# Patient Record
Sex: Male | Born: 1960 | Race: White | Hispanic: No | Marital: Married | Smoking: Never smoker
Health system: Southern US, Community
[De-identification: ages and names within clinical notes are randomized; demographics above are authoritative.]

## PROBLEM LIST (undated history)

## (undated) DIAGNOSIS — R7303 Prediabetes: Secondary | ICD-10-CM

## (undated) DIAGNOSIS — I10 Essential (primary) hypertension: Secondary | ICD-10-CM

## (undated) DIAGNOSIS — M1990 Unspecified osteoarthritis, unspecified site: Secondary | ICD-10-CM

## (undated) DIAGNOSIS — J309 Allergic rhinitis, unspecified: Secondary | ICD-10-CM

## (undated) DIAGNOSIS — E785 Hyperlipidemia, unspecified: Secondary | ICD-10-CM

## (undated) HISTORY — DX: Essential (primary) hypertension: I10

## (undated) HISTORY — DX: Allergic rhinitis, unspecified: J30.9

## (undated) HISTORY — DX: Hyperlipidemia, unspecified: E78.5

## (undated) HISTORY — DX: Unspecified osteoarthritis, unspecified site: M19.90

## (undated) HISTORY — DX: Prediabetes: R73.03

---

## 1999-06-16 ENCOUNTER — Encounter: Payer: Self-pay | Admitting: Internal Medicine

## 1999-06-16 ENCOUNTER — Ambulatory Visit (HOSPITAL_COMMUNITY): Admission: AD | Admit: 1999-06-16 | Discharge: 1999-06-16 | Payer: Self-pay | Admitting: Internal Medicine

## 2008-07-29 ENCOUNTER — Ambulatory Visit (HOSPITAL_COMMUNITY): Admission: RE | Admit: 2008-07-29 | Discharge: 2008-07-29 | Payer: Self-pay | Admitting: Internal Medicine

## 2012-03-14 ENCOUNTER — Other Ambulatory Visit (HOSPITAL_COMMUNITY): Payer: Self-pay | Admitting: Internal Medicine

## 2012-03-14 ENCOUNTER — Ambulatory Visit (HOSPITAL_COMMUNITY)
Admission: RE | Admit: 2012-03-14 | Discharge: 2012-03-14 | Disposition: A | Discharge status: Home / Self Care | Payer: BC Managed Care – PPO | Source: Ambulatory Visit | Attending: Internal Medicine | Admitting: Internal Medicine

## 2012-03-14 DIAGNOSIS — M47812 Spondylosis without myelopathy or radiculopathy, cervical region: Secondary | ICD-10-CM | POA: Insufficient documentation

## 2012-03-14 DIAGNOSIS — M25519 Pain in unspecified shoulder: Secondary | ICD-10-CM

## 2012-03-14 DIAGNOSIS — M542 Cervicalgia: Secondary | ICD-10-CM

## 2012-05-21 HISTORY — PX: COLONOSCOPY: SHX174

## 2013-01-07 ENCOUNTER — Encounter: Payer: Self-pay | Admitting: Internal Medicine

## 2013-02-26 ENCOUNTER — Encounter: Payer: Self-pay | Admitting: Internal Medicine

## 2013-02-26 ENCOUNTER — Ambulatory Visit (AMBULATORY_SURGERY_CENTER): Payer: Self-pay | Admitting: *Deleted

## 2013-02-26 VITALS — Ht 75.0 in | Wt 265.2 lb

## 2013-02-26 DIAGNOSIS — Z1211 Encounter for screening for malignant neoplasm of colon: Secondary | ICD-10-CM

## 2013-02-26 MED ORDER — MOVIPREP 100 G PO SOLR: ORAL | Status: DC

## 2013-02-26 NOTE — Progress Notes (Signed)
No allergies to eggs or soy. No prior anesthesia.  

## 2013-03-12 ENCOUNTER — Encounter: Payer: Self-pay | Admitting: Internal Medicine

## 2013-03-12 ENCOUNTER — Ambulatory Visit (AMBULATORY_SURGERY_CENTER): Payer: 59 | Admitting: Internal Medicine

## 2013-03-12 VITALS — BP 123/78 | HR 51 | Temp 98.1°F | Resp 15 | Ht 75.0 in | Wt 265.0 lb

## 2013-03-12 DIAGNOSIS — D126 Benign neoplasm of colon, unspecified: Secondary | ICD-10-CM

## 2013-03-12 DIAGNOSIS — Z1211 Encounter for screening for malignant neoplasm of colon: Secondary | ICD-10-CM

## 2013-03-12 MED ORDER — SODIUM CHLORIDE 0.9 % IV SOLN: 500.0000 mL | INTRAVENOUS | Status: DC

## 2013-03-12 NOTE — Patient Instructions (Signed)
YOU HAD AN ENDOSCOPIC PROCEDURE TODAY AT THE [facility]: Refer to the procedure report that was given to you for any specific questions about what was found during the examination.  If the procedure report does not answer your questions, please call your gastroenterologist to clarify.  If you requested that your care partner not be given the details of your procedure findings, then the procedure report has been included in a sealed envelope for you to review at your convenience later.  YOU SHOULD EXPECT: Some feelings of bloating in the abdomen. Passage of more gas than usual.  Walking can help get rid of the air that was put into your GI tract during the procedure and reduce the bloating. If you had a lower endoscopy (such as a colonoscopy or flexible sigmoidoscopy) you may notice spotting of blood in your stool or on the toilet paper. If you underwent a bowel prep for your procedure, then you may not have a normal bowel movement for a few days.  DIET: Your first meal following the procedure should be a light meal and then it is ok to progress to your normal diet.  A half-sandwich or bowl of soup is an example of a good first meal.  Heavy or fried foods are harder to digest and may make you feel nauseous or bloated.  Likewise meals heavy in dairy and vegetables can cause extra gas to form and this can also increase the bloating.  Drink plenty of fluids but you should avoid alcoholic beverages for 24 hours.  ACTIVITY: Your care partner should take you home directly after the procedure.  You should plan to take it easy, moving slowly for the rest of the day.  You can resume normal activity the day after the procedure however you should NOT DRIVE or use heavy machinery for 24 hours (because of the sedation medicines used during the test).    SYMPTOMS TO REPORT IMMEDIATELY: A gastroenterologist can be reached at any hour.  During normal business hours, 8:30 AM to 5:00 PM Monday through Friday,  call [phone].  After hours and on weekends, please call the GI answering service at [phone] who will take a message and have the physician on call contact you.   Following lower endoscopy (colonoscopy or flexible sigmoidoscopy):  Excessive amounts of blood in the stool  Significant tenderness or worsening of abdominal pains  Swelling of the abdomen that is new, acute  Fever of 100F or higher   FOLLOW UP: If any biopsies were taken you will be contacted by phone or by letter within the next 1-3 weeks.  Call your gastroenterologist if you have not heard about the biopsies in 3 weeks.  Our staff will call the home number listed on your records the next business day following your procedure to check on you and address any questions or concerns that you may have at that time regarding the information given to you following your procedure. This is a courtesy call and so if there is no answer at the home number and we have not heard from you through the emergency physician on call, we will assume that you have returned to your regular daily activities without incident.  SIGNATURES/CONFIDENTIALITY: You and/or your care partner have signed paperwork which will be entered into your electronic medical record.  These signatures attest to the fact that that the information above on your After Visit Summary has been reviewed and is understood.  Full responsibility of the confidentiality of   this discharge information lies with you and/or your care-partner.    You may resume your current medications today. Handout was given to your care partner on polyps. Please call if any questions or concerns.

## 2013-03-12 NOTE — Progress Notes (Signed)
No complaints noted in the recovery room. Maw   

## 2013-03-12 NOTE — Progress Notes (Signed)
Called to room to assist during endoscopic procedure.  Patient ID and intended procedure confirmed with present staff. Received instructions for my participation in the procedure from the performing physician.  

## 2013-03-12 NOTE — Op Note (Signed)
[facility] [address] [place], [address]   COLONOSCOPY PROCEDURE REPORT  PATIENT: Dale Tapia, Dale Tapia  MR#: [id number] BIRTHDATE: 1960/10/02 , 52  yrs. old GENDER: Male ENDOSCOPIST: Roxy Cedar, MD REFERRED [id number] Oneta Rack, M.D. PROCEDURE DATE:  03/12/2013 PROCEDURE:   Colonoscopy with snare polypectomy x 2 First Screening Colonoscopy - Avg.  risk and is 50 yrs.  old or older Yes.  Prior Negative Screening - Now for repeat screening. N/A  History of Adenoma - Now for follow-up colonoscopy & has been > or = to 3 yrs.  N/A  Polyps Removed Today? Yes. ASA CLASS:   Class II INDICATIONS:average risk screening. MEDICATIONS: MAC sedation, administered by CRNA and propofol (Diprivan) 350mg  IV  DESCRIPTION OF PROCEDURE:   After the risks benefits and alternatives of the procedure were thoroughly explained, informed consent was obtained.  A digital rectal exam revealed no abnormalities of the rectum.   The LB [id number]  endoscope was introduced through the anus and advanced to the cecum, which was identified by both the appendix and ileocecal valve. No adverse events experienced.   The quality of the prep was good, using MoviPrep  The instrument was then slowly withdrawn as the colon was fully examined.   COLON FINDINGS: The mucosa appeared normal in the terminal ileum. Two diminutive polyps were found at the cecum and in the ascending colon.  A polypectomy was performed with a cold snare.  The resection was complete and the polyp tissue was completely retrieved.   The colon mucosa was otherwise normal.  Retroflexed views revealed no abnormalities. The time to cecum=1 minutes 55 seconds.  Withdrawal time=10 minutes 28 seconds.  The scope was withdrawn and the procedure completed. COMPLICATIONS: There were no complications.  ENDOSCOPIC IMPRESSION: 1.   Normal mucosa in the terminal ileum 2.   Two diminutive polyps were found in the  colon;  polypectomy was performed with a cold snare 3.   The colon mucosa was otherwise normal  RECOMMENDATIONS: 1. Repeat colonoscopy in 5 years if polyp adenomatous; otherwise 10 years   eSigned:  Roxy Cedar, MD 03/12/2013 9:43 AM   cc: Lucky Cowboy, MD and The Patient

## 2013-03-13 ENCOUNTER — Telehealth: Payer: Self-pay | Admitting: *Deleted

## 2013-03-13 NOTE — Telephone Encounter (Signed)
  Follow up Call-  Call back number 03/12/2013  Post procedure Call Back phone  # [phone]  Permission to leave phone message Yes     Patient questions:  Do you have a fever, pain , or abdominal swelling? no Pain Score  0 *  Have you tolerated food without any problems? yes  Have you been able to return to your normal activities? yes  Do you have any questions about your discharge instructions: Diet   no Medications  no Follow up visit  no  Do you have questions or concerns about your Care? no  Actions: * If pain score is 4 or above: No action needed, pain <4.

## 2013-03-18 ENCOUNTER — Encounter: Payer: Self-pay | Admitting: Internal Medicine

## 2013-04-10 ENCOUNTER — Other Ambulatory Visit: Payer: Self-pay | Admitting: Emergency Medicine

## 2013-07-15 ENCOUNTER — Other Ambulatory Visit: Payer: Self-pay | Admitting: Physician Assistant

## 2013-07-31 ENCOUNTER — Other Ambulatory Visit: Payer: Self-pay | Admitting: Physician Assistant

## 2013-07-31 ENCOUNTER — Ambulatory Visit (INDEPENDENT_AMBULATORY_CARE_PROVIDER_SITE_OTHER): Payer: 59 | Admitting: Emergency Medicine

## 2013-07-31 ENCOUNTER — Encounter: Payer: Self-pay | Admitting: Emergency Medicine

## 2013-07-31 VITALS — BP 124/94 | HR 60 | Temp 98.2°F | Resp 18 | Ht 75.0 in | Wt 268.0 lb

## 2013-07-31 DIAGNOSIS — R7309 Other abnormal glucose: Secondary | ICD-10-CM

## 2013-07-31 DIAGNOSIS — R059 Cough, unspecified: Secondary | ICD-10-CM

## 2013-07-31 DIAGNOSIS — I10 Essential (primary) hypertension: Secondary | ICD-10-CM

## 2013-07-31 DIAGNOSIS — J329 Chronic sinusitis, unspecified: Secondary | ICD-10-CM

## 2013-07-31 DIAGNOSIS — M1990 Unspecified osteoarthritis, unspecified site: Secondary | ICD-10-CM

## 2013-07-31 DIAGNOSIS — J309 Allergic rhinitis, unspecified: Secondary | ICD-10-CM

## 2013-07-31 DIAGNOSIS — E782 Mixed hyperlipidemia: Secondary | ICD-10-CM

## 2013-07-31 DIAGNOSIS — E785 Hyperlipidemia, unspecified: Secondary | ICD-10-CM

## 2013-07-31 DIAGNOSIS — R05 Cough: Secondary | ICD-10-CM

## 2013-07-31 LAB — CBC WITH DIFFERENTIAL/PLATELET
Basophils Absolute: 0 K/uL (ref 0.0–0.1)
Basophils Relative: 0 % (ref 0–1)
Eosinophils Absolute: 0.2 K/uL (ref 0.0–0.7)
Eosinophils Relative: 2 % (ref 0–5)
HCT: 43.5 % (ref 39.0–52.0)
Hemoglobin: 15 g/dL (ref 13.0–17.0)
Lymphocytes Relative: 28 % (ref 12–46)
Lymphs Abs: 2.7 K/uL (ref 0.7–4.0)
MCH: 31.6 pg (ref 26.0–34.0)
MCHC: 34.5 g/dL (ref 30.0–36.0)
MCV: 91.6 fL (ref 78.0–100.0)
Monocytes Absolute: 1 K/uL (ref 0.1–1.0)
Monocytes Relative: 10 % (ref 3–12)
Neutro Abs: 5.9 K/uL (ref 1.7–7.7)
Neutrophils Relative %: 60 % (ref 43–77)
Platelets: 230 K/uL (ref 150–400)
RBC: 4.75 MIL/uL (ref 4.22–5.81)
RDW: 13.5 % (ref 11.5–15.5)
WBC: 9.8 K/uL (ref 4.0–10.5)

## 2013-07-31 LAB — HEMOGLOBIN A1C
Hgb A1c MFr Bld: 5.8 % — ABNORMAL HIGH
Mean Plasma Glucose: 120 mg/dL — ABNORMAL HIGH

## 2013-07-31 MED ORDER — ALBUTEROL SULFATE HFA 108 (90 BASE) MCG/ACT IN AERS: 2.0000 | INHALATION_SPRAY | Freq: Four times a day (QID) | RESPIRATORY_TRACT | Status: DC | PRN

## 2013-07-31 MED ORDER — BENZONATATE 100 MG PO CAPS: 100.0000 mg | ORAL_CAPSULE | Freq: Three times a day (TID) | ORAL | Status: DC | PRN

## 2013-07-31 MED ORDER — LEVOFLOXACIN 500 MG PO TABS: 500.0000 mg | ORAL_TABLET | Freq: Every day | ORAL | Status: AC

## 2013-07-31 NOTE — Progress Notes (Signed)
   Subjective:    Patient ID: Dale Tapia, male    DOB: 1960-09-12, 53 y.o.   MRN: [id number]  HPI Comments: 53 yo NON-Compliant male presents for 3 month F/U for HTN, Cholesterol, Pre-Dm, D. Deficient. Last labs. BS 111 T 168 TG 105 H 45 L 102 A1C 5.6 D 30 INSULIN 106 HE HAS BEEN ACTIVE AT WORK ONLY. He is not eating as health. BP has been trying to get BP at house but cuff to small 140s/ 80.   He has head congestion x 3 weeks with increased ear sensitivity. He has had congestion in chest but not as bad as head. He has not tried any OTC.   Sinusitis Associated symptoms include coughing and ear pain.  Cough Associated symptoms include ear pain.  Otalgia  Associated symptoms include coughing.      Medication List       This list is accurate as of: 07/31/13 11:33 AM.  Always use your most recent med list.               atenolol 100 MG tablet  Commonly known as:  TENORMIN  Take 100 mg by mouth daily.     losartan 100 MG tablet  Commonly known as:  COZAAR  Take 100 mg by mouth daily.       No Known Allergies Past Medical History  Diagnosis Date  . Hypertension   . Arthritis     neck and knees  . Allergy      Review of Systems  HENT: Positive for ear pain.   Respiratory: Positive for cough.    BP 124/94  Pulse 60  Temp(Src) 98.2 F (36.8 C) (Temporal)  Resp 18  Ht 6\' 3"  (1.905 m)  Wt 268 lb (121.564 kg)  BMI 33.50 kg/m2     Objective:   Physical Exam  Nursing note and vitals reviewed. Constitutional: He is oriented to person, place, and time. He appears well-developed and well-nourished.  HENT:  Head: Normocephalic and atraumatic.  Right Ear: External ear normal.  Left Ear: External ear normal.  Nose: Nose normal.  Mouth/Throat: Oropharynx is clear and moist. No oropharyngeal exudate.  Yellow TMs bilateral Maxillary tenderness Frontal tenderness    Eyes: Conjunctivae and EOM are normal.  Neck: Normal range of motion. Neck supple. No JVD  present. No thyromegaly present.  Cardiovascular: Normal rate, regular rhythm, normal heart sounds and intact distal pulses.   Pulmonary/Chest: Effort normal and breath sounds normal.  Abdominal: Soft. Bowel sounds are normal. He exhibits no distension and no mass. There is no tenderness. There is no rebound and no guarding.  Musculoskeletal: Normal range of motion. He exhibits no edema and no tenderness.  Lymphadenopathy:    He has no cervical adenopathy.  Neurological: He is alert and oriented to person, place, and time. He has normal reflexes. No cranial nerve deficit. Coordination normal.  Skin: Skin is warm and dry.  Psychiatric: He has a normal mood and affect. His behavior is normal. Judgment and thought content normal.          Assessment & Plan:  1.  3 month F/U for NON COmpliant HTN, Cholesterol, Pre-Dm, D. Deficient. Needs healthy diet, cardio QD and obtain healthy weight. Check Labs, Check BP if >130/80 call office  2. Sinusitis/ cough/ Allergic rhinitis- Allegra OTC, increase H2o, allergy hygiene explained. Levaquin 500 mg AD, Albuterol HFA AD, Tessalon Perles AD, w/c if SX increase or ER.

## 2013-07-31 NOTE — Patient Instructions (Signed)
We want weight loss that will last so you should lose 1-2 pounds a week.  THAT IS IT! Please pick THREE things a month to change. Once it is a habit check off the item. Then pick another three items off the list to become habits.  If you are already doing a habit on the list GREAT!  Cross that item off! o Don't drink your calories. Ie, alcohol, soda, fruit juice, and sweet tea.  o Drink more water. Drink a glass when you feel hungry or before each meal.  o Eat breakfast - Complex carb and protein (likeDannon light and fit yogurt, oatmeal, fruit, eggs, [place] bacon). o Measure your cereal.  Eat no more than one cup a day. (ie [place]) o Eat an apple a day. o Add a vegetable a day. o Try a new vegetable a month. o Use Pam! Stop using oil or butter to cook. o Don't finish your plate or use smaller plates. o Share your dessert. o Eat sugar free Jello for dessert or frozen grapes. o Don't eat 2-3 hours before bed. o Switch to whole wheat bread, pasta, and brown rice. o Make healthier choices when you eat out. No fries! o Pick baked chicken, NOT fried. o Don't forget to SLOW DOWN when you eat. It is not going anywhere.  o Take the stairs. o Park far away in the parking lot o [employer] (or weights) for 10 minutes while watching TV. o Walk at work for 10 minutes during break. o Walk outside 1 time a week with your friend, kids, dog, or significant other. o Start a walking group at [place] the mall as much as you can tolerate.  o Keep a food diary. o Weigh yourself daily. o Walk for 15 minutes 3 days per week. o Cook at home more often and eat out less.  If life happens and you go back to old habits, it is okay.  Just start over. You can do it!   If you experience chest pain, get short of breath, or tired during the exercise, please stop immediately and inform your doctor.    Bad carbs also include fruit juice, alcohol, and sweet tea. These are empty calories that do not signal to  your brain that you are full.   Please remember the good carbs are still carbs which convert into sugar. So please measure them out no more than 1/2-1 cup of rice, oatmeal, pasta, and beans.  Veggies are however free foods! Pile them on.   I like lean protein at every meal such as chicken, [place], pork chops, cottage cheese, etc. Just do not fry these meats and please center your meal around vegetable, the meats should be a side dish.   No all fruit is created equal. Please see the list below, the fruit at the bottom is higher in sugars than the fruit at the top   Hypertension Hypertension is another name for high blood pressure. High blood pressure may mean that your heart needs to work harder to pump blood. Blood pressure consists of two numbers, which includes a higher number over a lower number (example: 110/72). HOME CARE   Make lifestyle changes as told by your doctor. This may include weight loss and exercise.  Take your blood pressure medicine every day.  Limit how much salt you use.  Stop smoking if you smoke.  Do not use drugs.  Talk to your doctor if you are using decongestants or birth  control pills. These medicines might make blood pressure higher.  Females should not drink more than 1 alcoholic drink per day. Males should not drink more than 2 alcoholic drinks per day.  See your doctor as told. GET HELP RIGHT AWAY IF:   You have a blood pressure reading with a top number of 180 or higher.  You get a very bad headache.  You get blurred or changing vision.  You feel confused.  You feel weak, numb, or faint.  You get chest or belly (abdominal) pain.  You throw up (vomit).  You cannot breathe very well. MAKE SURE YOU:   Understand these instructions.  Will watch your condition.  Will get help right away if you are not doing well or get worse. Document Released: 10/24/2007 Document Revised: 07/30/2011 Document Reviewed: 10/24/2007 [facility] Patient  Information 2014 [place], [place]. Sinusitis Sinusitis is redness, soreness, and puffiness (inflammation) of the air pockets in the bones of your face (sinuses). The redness, soreness, and puffiness can cause air and mucus to get trapped in your sinuses. This can allow germs to grow and cause an infection.  HOME CARE   Drink enough fluids to keep your pee (urine) clear or pale yellow.  Use a humidifier in your home.  Run a hot shower to create steam in the bathroom. Sit in the bathroom with the door closed. Breathe in the steam 3 4 times a day.  Put a warm, moist washcloth on your face 3 4 times a day, or as told by your doctor.  Use salt water sprays (saline sprays) to wet the thick fluid in your nose. This can help the sinuses drain.  Only take medicine as told by your doctor. GET HELP RIGHT AWAY IF:   Your pain gets worse.  You have very bad headaches.  You are sick to your stomach (nauseous).  You throw up (vomit).  You are very sleepy (drowsy) all the time.  Your face is puffy (swollen).  Your vision changes.  You have a stiff neck.  You have trouble breathing. MAKE SURE YOU:   Understand these instructions.  Will watch your condition.  Will get help right away if you are not doing well or get worse. Document Released: 10/24/2007 Document Revised: 01/30/2012 Document Reviewed: 12/11/2011 [facility] Patient Information 2014 [place].

## 2013-08-01 ENCOUNTER — Encounter: Payer: Self-pay | Admitting: Emergency Medicine

## 2013-08-01 DIAGNOSIS — E1169 Type 2 diabetes mellitus with other specified complication: Secondary | ICD-10-CM | POA: Insufficient documentation

## 2013-08-01 DIAGNOSIS — J309 Allergic rhinitis, unspecified: Secondary | ICD-10-CM | POA: Insufficient documentation

## 2013-08-01 DIAGNOSIS — I10 Essential (primary) hypertension: Secondary | ICD-10-CM | POA: Insufficient documentation

## 2013-08-01 DIAGNOSIS — M1990 Unspecified osteoarthritis, unspecified site: Secondary | ICD-10-CM | POA: Insufficient documentation

## 2013-08-01 DIAGNOSIS — E785 Hyperlipidemia, unspecified: Secondary | ICD-10-CM | POA: Insufficient documentation

## 2013-08-01 LAB — HEPATIC FUNCTION PANEL
ALT: 31 U/L (ref 0–53)
AST: 34 U/L (ref 0–37)
Albumin: 4.5 g/dL (ref 3.5–5.2)
Alkaline Phosphatase: 80 U/L (ref 39–117)
BILIRUBIN DIRECT: 0.2 mg/dL (ref 0.0–0.3)
BILIRUBIN INDIRECT: 0.8 mg/dL (ref 0.2–1.2)
Total Bilirubin: 1 mg/dL (ref 0.2–1.2)
Total Protein: 6.8 g/dL (ref 6.0–8.3)

## 2013-08-01 LAB — BASIC METABOLIC PANEL WITH GFR
BUN: 17 mg/dL (ref 6–23)
CALCIUM: 9.5 mg/dL (ref 8.4–10.5)
CO2: 28 mEq/L (ref 19–32)
Chloride: 104 mEq/L (ref 96–112)
Creat: 1.08 mg/dL (ref 0.50–1.35)
GFR, EST NON AFRICAN AMERICAN: 80 mL/min
GLUCOSE: 81 mg/dL (ref 70–99)
POTASSIUM: 4.5 meq/L (ref 3.5–5.3)
SODIUM: 141 meq/L (ref 135–145)

## 2013-08-01 LAB — INSULIN, FASTING: Insulin fasting, serum: 11 u[IU]/mL (ref 3–28)

## 2013-08-01 LAB — LIPID PANEL
Cholesterol: 190 mg/dL (ref 0–200)
HDL: 54 mg/dL (ref >39)
LDL CALC: 122 mg/dL — AB (ref 0–99)
Total CHOL/HDL Ratio: 3.5 Ratio
Triglycerides: 68 mg/dL (ref <150)
VLDL: 14 mg/dL (ref 0–40)

## 2013-08-03 ENCOUNTER — Other Ambulatory Visit: Payer: Self-pay | Admitting: Internal Medicine

## 2013-08-07 ENCOUNTER — Other Ambulatory Visit: Payer: Self-pay | Admitting: Internal Medicine

## 2013-10-30 ENCOUNTER — Other Ambulatory Visit: Payer: Self-pay | Admitting: Physician Assistant

## 2013-11-09 ENCOUNTER — Other Ambulatory Visit: Payer: Self-pay | Admitting: *Deleted

## 2013-11-09 MED ORDER — CELEBREX 200 MG PO CAPS
ORAL_CAPSULE | ORAL | Status: DC
Start: 2013-11-09 — End: 2014-03-19

## 2013-11-09 MED ORDER — LOSARTAN POTASSIUM 100 MG PO TABS: 100.0000 mg | ORAL_TABLET | Freq: Every day | ORAL | Status: DC

## 2013-12-31 IMAGING — CR DG CERVICAL SPINE COMPLETE 4+V
5 series · 5 of 5 positions shown · non-contrast
Comparison: None.

CLINICAL DATA: Injury with left-sided pain

CERVICAL SPINE - COMPLETE 4+ VIEW

[view not recorded (1 of 5)]
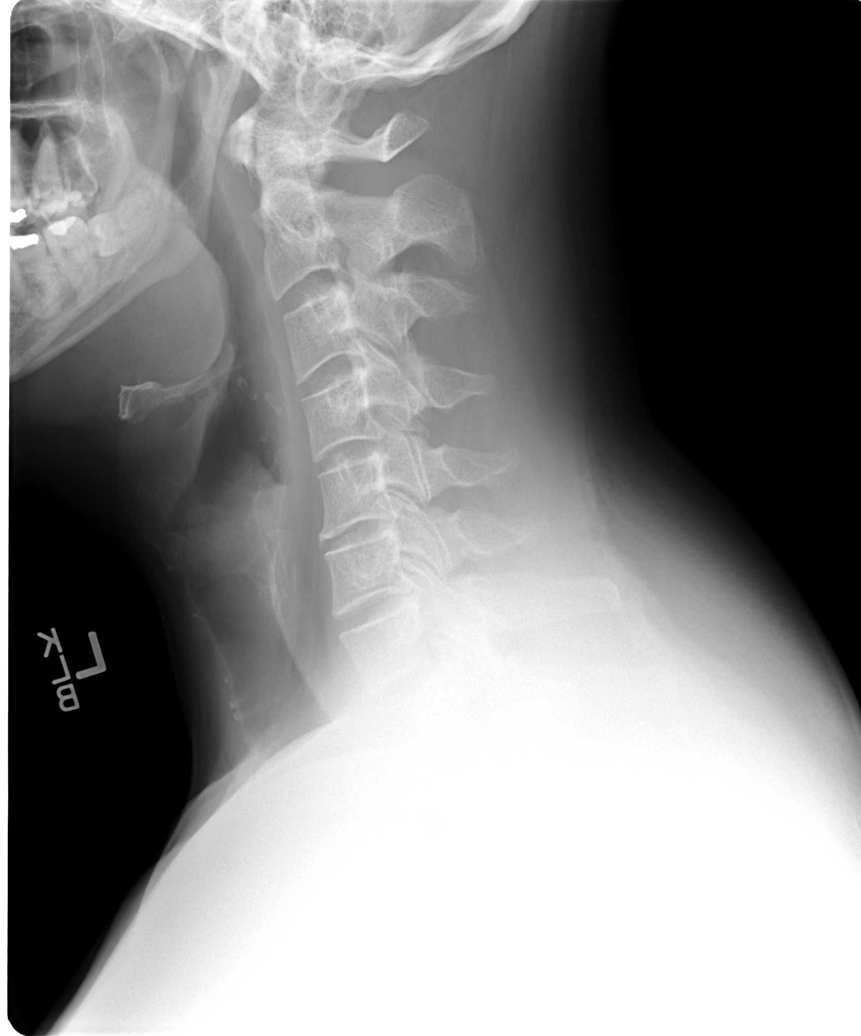

[view not recorded (2 of 5)]
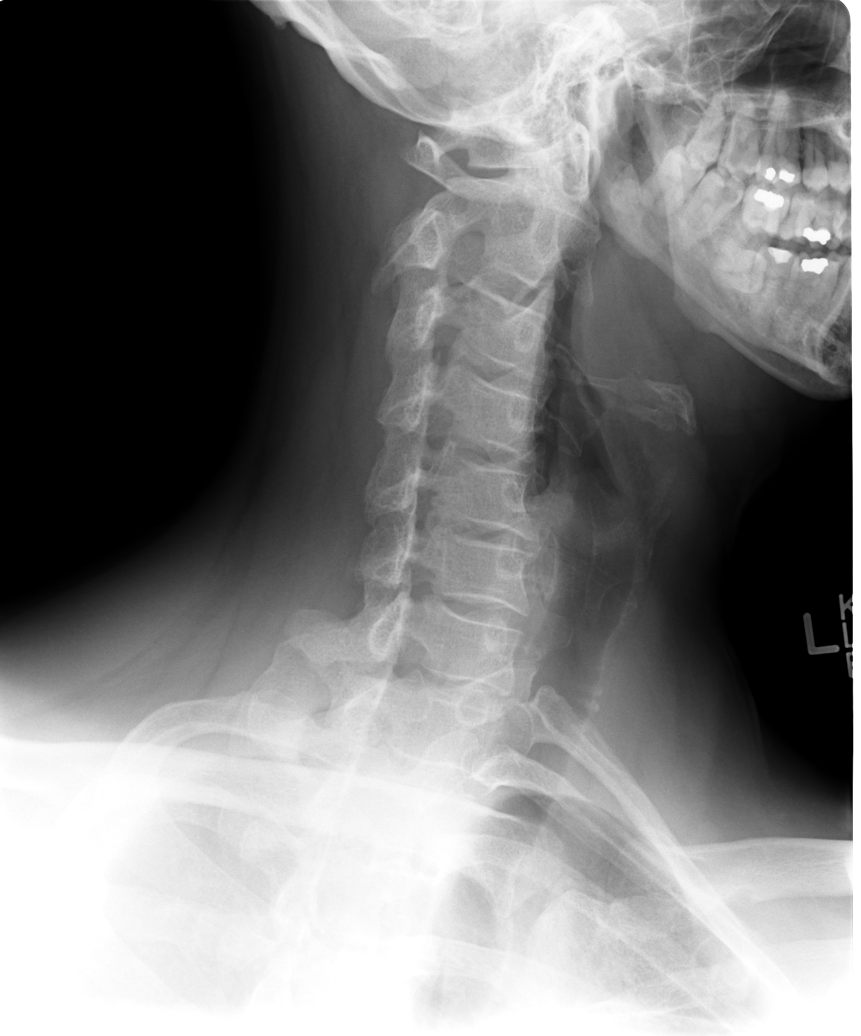

[view not recorded (3 of 5)]
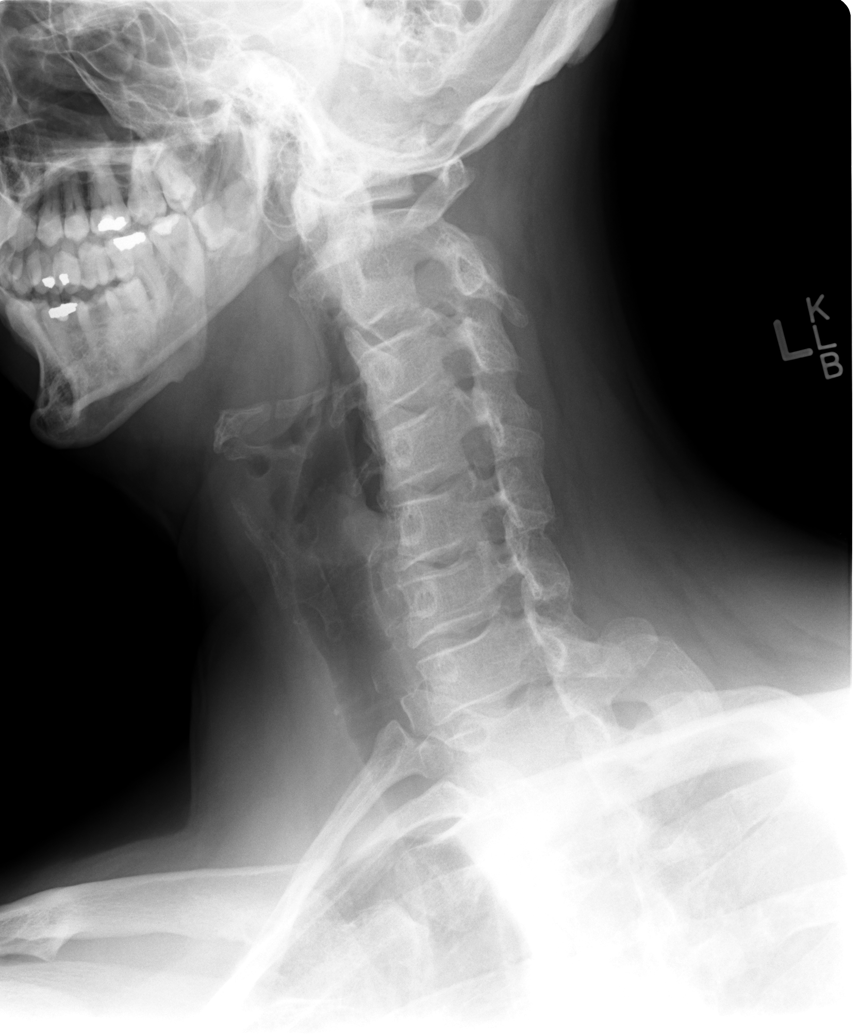

[view not recorded (4 of 5)]
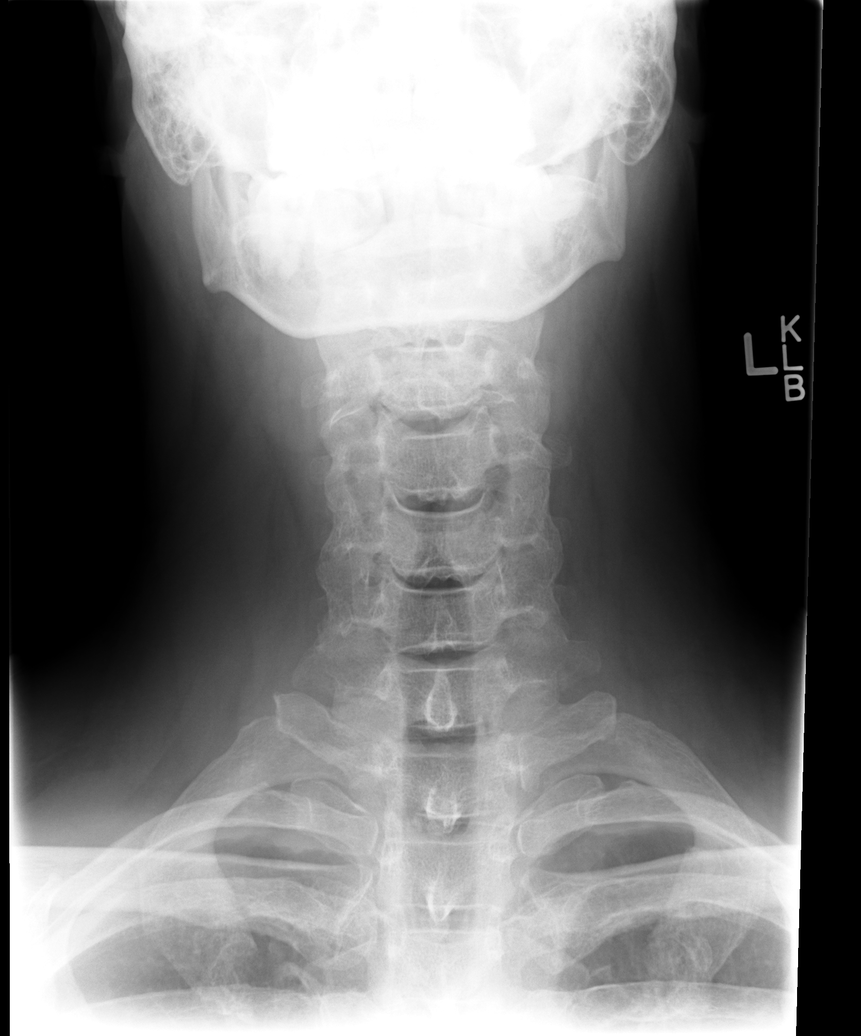

[view not recorded (5 of 5)]
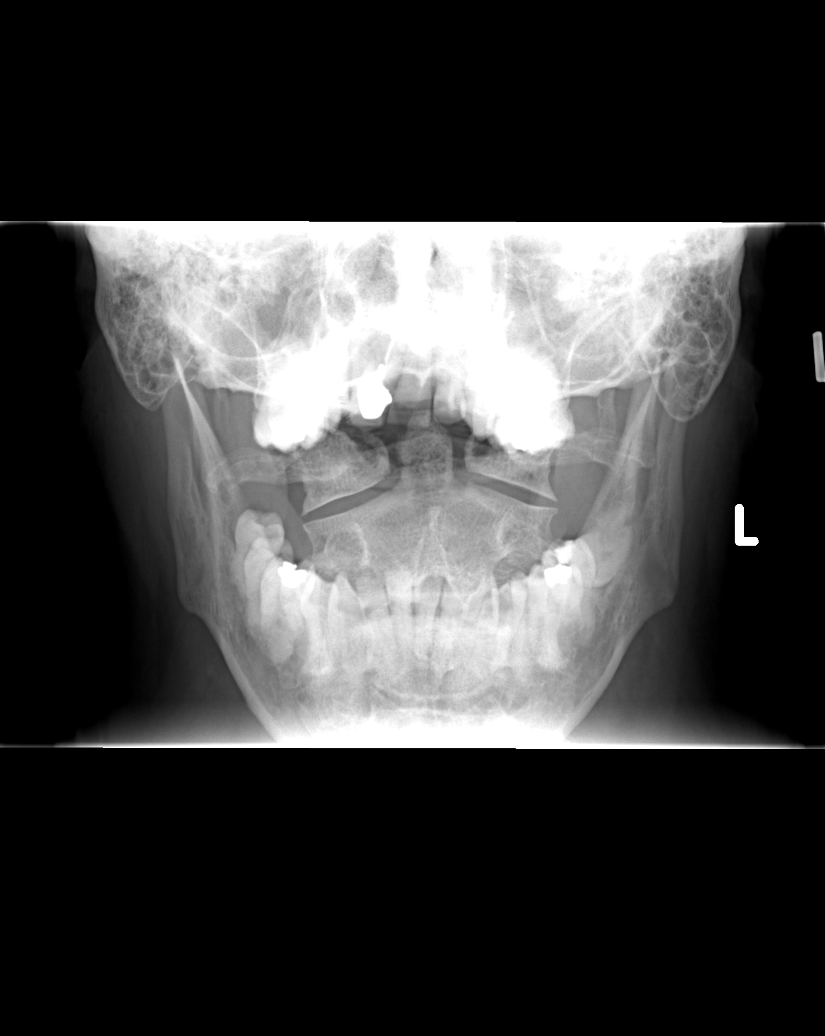

[5 of 5 positions shown; findings below may reference images not displayed]

FINDINGS: There is straightening of the neck on the lateral view.
There is degenerative spondylosis at C5-6 and C6-7 with mild disc
space narrowing and small marginal osteophytes.  These cause mild
encroachment upon the neural foramina.  No sign of fracture or soft
tissue swelling.
IMPRESSION: Chronic appearing spondylosis at C5-6 and C6-7.

## 2014-01-02 DIAGNOSIS — R7303 Prediabetes: Secondary | ICD-10-CM | POA: Insufficient documentation

## 2014-01-06 ENCOUNTER — Encounter: Payer: Self-pay | Admitting: Physician Assistant

## 2014-03-10 ENCOUNTER — Telehealth: Payer: Self-pay

## 2014-03-10 MED ORDER — PREDNISONE 20 MG PO TABS: ORAL_TABLET | ORAL | Status: DC

## 2014-03-10 NOTE — Telephone Encounter (Signed)
Patient called c/o cough, sinus issues, headache since Monday (2 days) , per Vicie Mutters, PA returned call to patient lmom and advised that since it has only been two days that he try Allegra, increase water/fluids, may try Aleve or Advil Sinus OTC, also sent in some Prednisolone  to try if needed, advised may need antibiotic if no better after at least 7-10 days

## 2014-03-19 ENCOUNTER — Encounter: Payer: Self-pay | Admitting: Physician Assistant

## 2014-03-19 ENCOUNTER — Ambulatory Visit (INDEPENDENT_AMBULATORY_CARE_PROVIDER_SITE_OTHER): Payer: BC Managed Care – PPO | Admitting: Physician Assistant

## 2014-03-19 ENCOUNTER — Ambulatory Visit: Payer: Self-pay | Admitting: Physician Assistant

## 2014-03-19 VITALS — BP 140/88 | HR 62 | Temp 98.6°F | Resp 18 | Ht 75.0 in | Wt 268.0 lb

## 2014-03-19 DIAGNOSIS — H6693 Otitis media, unspecified, bilateral: Secondary | ICD-10-CM

## 2014-03-19 DIAGNOSIS — R059 Cough, unspecified: Secondary | ICD-10-CM

## 2014-03-19 DIAGNOSIS — R05 Cough: Secondary | ICD-10-CM

## 2014-03-19 MED ORDER — AMOXICILLIN-POT CLAVULANATE 875-125 MG PO TABS: 1.0000 | ORAL_TABLET | Freq: Two times a day (BID) | ORAL | Status: DC

## 2014-03-19 MED ORDER — BENZONATATE 100 MG PO CAPS: 100.0000 mg | ORAL_CAPSULE | Freq: Four times a day (QID) | ORAL | Status: DC | PRN

## 2014-03-19 NOTE — Progress Notes (Signed)
Subjective:    Patient ID: Dale Tapia, male    DOB: Apr 20, 1961, 53 y.o.   MRN: [id number]  Cough This is a new problem. Episode onset: 1 week ago. The problem has been unchanged. Episode frequency: Cough comes and goes. The cough is non-productive. Associated symptoms include ear congestion, headaches, postnasal drip, a sore throat, shortness of breath and wheezing. Pertinent negatives include no chest pain, chills, ear pain, fever, heartburn, myalgias, nasal congestion, rash, rhinorrhea or sweats. Associated symptoms comments: Patient states he thinks the sore throat is from post nasal drip.. The symptoms are aggravated by lying down. Treatments tried: Advil Cold and Sinus and helped a little.  Prednisone.   The treatment provided mild relief. His past medical history is significant for environmental allergies.   Review of Systems  Constitutional: Negative.  Negative for fever, chills and fatigue.  HENT: Positive for postnasal drip and sore throat. Negative for dental problem, ear discharge, ear pain, rhinorrhea and trouble swallowing.        Scratchy voice  Eyes: Negative.   Respiratory: Positive for cough, shortness of breath and wheezing. Negative for chest tightness and stridor.        Patient states he did hear wheezing for a day or two.  Cardiovascular: Negative.  Negative for chest pain.  Gastrointestinal: Negative.  Negative for heartburn.  Musculoskeletal: Negative.  Negative for myalgias.  Skin: Negative.  Negative for rash.  Allergic/Immunologic: Positive for environmental allergies.  Neurological: Positive for headaches. Negative for dizziness and light-headedness.  Psychiatric/Behavioral: Negative.    Past Medical History  Diagnosis Date  . Hypertension   . Arthritis     neck and knees  . Allergic rhinitis, cause unspecified   . Hyperlipidemia   . Prediabetes    Current Outpatient Prescriptions on File Prior to Visit  Medication Sig Dispense Refill  . albuterol  (PROVENTIL HFA;VENTOLIN HFA) 108 (90 BASE) MCG/ACT inhaler Inhale 2 puffs into the lungs every 6 (six) hours as needed for wheezing or shortness of breath.  1 Inhaler  2  . atenolol (TENORMIN) 100 MG tablet TAKE ONE TABLET BY MOUTH ONCE DAILY  90 tablet  0  . losartan (COZAAR) 100 MG tablet Take 1 tablet (100 mg total) by mouth daily.  90 tablet  1   No current facility-administered medications on file prior to visit.   No Known Allergies    BP 140/88  Pulse 62  Temp(Src) 98.6 F (37 C) (Temporal)  Resp 18  Ht 6\' 3"  (1.905 m)  Wt 268 lb (121.564 kg)  BMI 33.50 kg/m2  SpO2 98%  Objective:   Physical Exam  Constitutional: He is oriented to person, place, and time. Vital signs are normal. He appears well-developed and well-nourished. He has a sickly appearance. No distress.  HENT:  Head: Normocephalic.  Right Ear: There is swelling and tenderness. No drainage. Tympanic membrane is injected, erythematous and bulging. Tympanic membrane is not scarred, not perforated and not retracted. A middle ear effusion is present.  Left Ear: External ear and ear canal normal. No drainage or swelling. Tympanic membrane is bulging. Tympanic membrane is not injected, not scarred, not perforated, not erythematous and not retracted. A middle ear effusion is present.  Nose: Nose normal. No mucosal edema, rhinorrhea or sinus tenderness. Right sinus exhibits no maxillary sinus tenderness and no frontal sinus tenderness. Left sinus exhibits no maxillary sinus tenderness and no frontal sinus tenderness.  Mouth/Throat: Uvula is midline and mucous membranes are normal. Mucous membranes are  not pale and not dry. No trismus in the jaw. No uvula swelling. Posterior oropharyngeal erythema present. No oropharyngeal exudate, posterior oropharyngeal edema or tonsillar abscesses.  Right ear canal was erythematous but not edematous.  Right TM was erythematous, injected and bulging with decreased cone of light.    Left ear  canal was not erythematous or edematous.  Left TM was non-erythematous and non-edematous with normal cone of light.  Left TM was bulging with clear fluid behind TM.  Turbinates were mildly erythematous but not edematous.  Mild posterior oropharyngeal erythema.  Eyes: Conjunctivae and lids are normal. Pupils are equal, round, and reactive to light. Right eye exhibits no discharge. Left eye exhibits no discharge. No scleral icterus.  Neck: Trachea normal and normal range of motion. Neck supple. No tracheal tenderness present. No tracheal deviation present.  Cardiovascular: Normal rate, regular rhythm, S1 normal, S2 normal, normal heart sounds and normal pulses.  Exam reveals no gallop, no distant heart sounds and no friction rub.   No murmur heard. Pulmonary/Chest: Effort normal and breath sounds normal. No stridor. Not tachypneic and not bradypneic. No respiratory distress. He has no decreased breath sounds. He has no wheezes. He has no rhonchi. He has no rales. He exhibits no tenderness.  Abdominal: Soft. There is no tenderness. There is no rebound and no guarding.  Musculoskeletal: Normal range of motion.  Lymphadenopathy:       Head (right side): No submental, no submandibular, no tonsillar, no preauricular, no posterior auricular and no occipital adenopathy present.       Head (left side): No submental, no submandibular, no tonsillar, no preauricular, no posterior auricular and no occipital adenopathy present.    He has no cervical adenopathy.       Right: No supraclavicular adenopathy present.       Left: No supraclavicular adenopathy present.  Neurological: He is alert and oriented to person, place, and time.  Skin: Skin is warm, dry and intact. No rash noted. He is not diaphoretic.  Psychiatric: He has a normal mood and affect. His speech is normal and behavior is normal. Judgment and thought content normal.      Assessment & Plan:  1. Bilateral acute otitis media, recurrence not  specified, unspecified otitis media type - Take Augmentin as prescribed-amoxicillin-clavulanate (AUGMENTIN) 875-125 MG per tablet; Take 1 tablet by mouth 2 (two) times daily. 10 days  Dispense: 20 tablet; Refill: 0 - While drinking fluids, pinch and hold your nose while swallowing to open eustachian tubes. -Take Allegra OTC- take 1 tablet by mouth daily for allergies. -Drink plenty of fluids to stay hydrated.  Every day you lose water through your breath, perspiration, urine and bowel movements. For your body to function properly, you must replenish its water supply by consuming beverages and foods that contain water. So how much fluid does the average, healthy adult living in a temperate climate need? The Institute of Medicine determined that an adequate intake (AI) for men is roughly about 13 cups (3 liters) of total beverages a day.  Please try to drink water or flavored water. Try to avoid soda due to it causing dehydration.  If you have a hard time with quitting soda, try to drink diet soda.  2. Cough -Take Tessalon Perles as prescribed for cough- benzonatate (TESSALON PERLES) 100 MG capsule; Take 1 capsule (100 mg total) by mouth every 6 (six) hours as needed for cough.  Dispense: 60 capsule; Refill: 1  Discussed all medication effects and SE's. If  you are not feeling better in 7-10 days, then please call the office.  Katherine Syme, Stephani Police, PA-C 9:48 AM [facility]

## 2014-03-19 NOTE — Patient Instructions (Addendum)
-   Take Augmentin as prescribed. - Take Tessalon Perles as prescribed for cough. -Drink plenty of fluids. Every day you lose water through your breath, perspiration, urine and bowel movements. For your body to function properly, you must replenish its water supply by consuming beverages and foods that contain water.  So how much fluid does the average, healthy adult living in a temperate climate need? The Institute of Medicine determined that an adequate intake (AI) for men is roughly about 13 cups (3 liters) of total beverages a day. The AI for women is about 9 cups (2.2 liters) of total beverages a day.  Please try to drink water or flavored water. Try to avoid soda due to it causing dehydration.  If you have a hard time with quitting soda, try to drink diet soda. - Take Allegra OTC- take 1 tablet daily for allergies. - while drinking fluids, pinch your nose and swallow to open eustachian tubes.  If you are not feeling better in 7-10 days, then please call the office.  Otitis Media Otitis media is redness, soreness, and inflammation of the middle ear. Otitis media may be caused by allergies or, most commonly, by infection. Often it occurs as a complication of the common cold. SIGNS AND SYMPTOMS Symptoms of otitis media may include:  Earache.  Fever.  Ringing in your ear.  Headache.  Leakage of fluid from the ear. DIAGNOSIS To diagnose otitis media, your health care provider will examine your ear with an otoscope. This is an instrument that allows your health care provider to see into your ear in order to examine your eardrum. Your health care provider also will ask you questions about your symptoms. TREATMENT  Typically, otitis media resolves on its own within 3-5 days. Your health care provider may prescribe medicine to ease your symptoms of pain. If otitis media does not resolve within 5 days or is recurrent, your health care provider may prescribe antibiotic medicines if he or she  suspects that a bacterial infection is the cause. HOME CARE INSTRUCTIONS   If you were prescribed an antibiotic medicine, finish it all even if you start to feel better.  Take medicines only as directed by your health care provider.  Keep all follow-up visits as directed by your health care provider. SEEK MEDICAL CARE IF:  You have otitis media only in one ear, or bleeding from your nose, or both.  You notice a lump on your neck.  You are not getting better in 3-5 days.  You feel worse instead of better. SEEK IMMEDIATE MEDICAL CARE IF:   You have pain that is not controlled with medicine.  You have swelling, redness, or pain around your ear or stiffness in your neck.  You notice that part of your face is paralyzed.  You notice that the bone behind your ear (mastoid) is tender when you touch it. MAKE SURE YOU:   Understand these instructions.  Will watch your condition.  Will get help right away if you are not doing well or get worse. Document Released: 02/10/2004 Document Revised: 09/21/2013 Document Reviewed: 12/02/2012 [facility] Patient Information 2015 [place], [place]. This information is not intended to replace advice given to you by your health care provider. Make sure you discuss any questions you have with your health care provider.

## 2014-04-05 ENCOUNTER — Ambulatory Visit (INDEPENDENT_AMBULATORY_CARE_PROVIDER_SITE_OTHER): Payer: BC Managed Care – PPO | Admitting: Physician Assistant

## 2014-04-05 ENCOUNTER — Encounter: Payer: Self-pay | Admitting: Physician Assistant

## 2014-04-05 VITALS — BP 162/98 | HR 66 | Temp 99.9°F | Resp 18 | Ht 75.0 in | Wt 276.0 lb

## 2014-04-05 DIAGNOSIS — R7303 Prediabetes: Secondary | ICD-10-CM

## 2014-04-05 DIAGNOSIS — H6691 Otitis media, unspecified, right ear: Secondary | ICD-10-CM

## 2014-04-05 DIAGNOSIS — E349 Endocrine disorder, unspecified: Secondary | ICD-10-CM

## 2014-04-05 DIAGNOSIS — M1990 Unspecified osteoarthritis, unspecified site: Secondary | ICD-10-CM

## 2014-04-05 DIAGNOSIS — M545 Low back pain, unspecified: Secondary | ICD-10-CM

## 2014-04-05 DIAGNOSIS — E785 Hyperlipidemia, unspecified: Secondary | ICD-10-CM

## 2014-04-05 DIAGNOSIS — I10 Essential (primary) hypertension: Secondary | ICD-10-CM

## 2014-04-05 DIAGNOSIS — E559 Vitamin D deficiency, unspecified: Secondary | ICD-10-CM

## 2014-04-05 DIAGNOSIS — R7309 Other abnormal glucose: Secondary | ICD-10-CM

## 2014-04-05 MED ORDER — CEFUROXIME AXETIL 500 MG PO TABS: 500.0000 mg | ORAL_TABLET | Freq: Two times a day (BID) | ORAL | Status: DC

## 2014-04-05 MED ORDER — MELOXICAM 7.5 MG PO TABS: 7.5000 mg | ORAL_TABLET | Freq: Every day | ORAL | Status: DC

## 2014-04-05 NOTE — Progress Notes (Signed)
Subjective:    Patient ID: Dale Tapia, male    DOB: [birth year], 53 y.o.   MRN: [id number]  HPI 53 y.o. male  presents for 3 month follow up with hypertension, hyperlipidemia, prediabetes and vitamin D.  He also presents for continuing bilateral ear pain and nasal congestion.  Patient was last seen on 03/19/14 and put on Augmentin for 10 days and Tessalon Perles for otitis media and cough.  Patient states he took medication as prescribed.  He states the tessalon perles are helping with cough.  Patient states he also had a nose bleed and noticed dried up blood in nose and blood in nasal secretions.   His blood pressure has not been controlled at home, today their BP is BP: (!) 162/98 mmHg He does not workout. He denies chest pain, shortness of breath, dizziness.   He is not on cholesterol medication and denies myalgias. His cholesterol is not at goal. The cholesterol last visit was:   Lab Results  Component Value Date   CHOL 190 07/31/2013   HDL 54 07/31/2013   LDLCALC 122* 07/31/2013   TRIG 68 07/31/2013   CHOLHDL 3.5 07/31/2013   He has not been working on diet and exercise for prediabetes, and denies increased appetite, polydipsia and polyuria. Last A1C in the office was:  Lab Results  Component Value Date   HGBA1C 5.8* 07/31/2013   Patient is on Vitamin D supplement.  Mulitvitamin- twice a day. No results found for: VD25OH    Current Medications:  Current Outpatient Prescriptions on File Prior to Visit  Medication Sig Dispense Refill  . atenolol (TENORMIN) 100 MG tablet TAKE ONE TABLET BY MOUTH ONCE DAILY 90 tablet 0  . losartan (COZAAR) 100 MG tablet Take 1 tablet (100 mg total) by mouth daily. 90 tablet 1   No current facility-administered medications on file prior to visit.  Patient states he is also taking supplement for testosterone OTC. Medical History:  Past Medical History  Diagnosis Date  . Hypertension   . Arthritis     neck and knees  . Allergic rhinitis,  cause unspecified   . Hyperlipidemia   . Prediabetes    Allergies: No Known Allergies  Family history- Review and unchanged Social history- Review and unchanged  Review of Systems  Constitutional: Positive for fever. Negative for chills, diaphoresis and fatigue.  HENT: Positive for congestion and ear pain. Negative for dental problem, ear discharge, postnasal drip, rhinorrhea, sinus pressure, sore throat, trouble swallowing and voice change.   Eyes: Negative.   Respiratory: Negative.   Cardiovascular: Negative.   Gastrointestinal: Negative.   Endocrine: Negative.  Negative for polydipsia, polyphagia and polyuria.  Genitourinary: Negative.   Musculoskeletal: Positive for back pain and neck pain. Negative for myalgias and gait problem.       States he picked up 200 pound TV about 2 weeks ago.  He thinks he pulled his back out.  He states the pain is a 5/6 out of 10 right now.  He states that bending over hurts more and sitting for long periods of time.  He states the pain stays in low back and does not radiate anywhere.  Patient states he was diagnosed with arthritis in neck.   Skin: Negative.  Negative for rash.  Neurological: Negative.   Psychiatric/Behavioral: Negative.        He states he is working nights and is having difficulty sleeping.     BP 162/98 mmHg  Pulse 66  Temp(Src) 99.9 F (37.7  C) (Temporal)  Resp 18  Ht 6\' 3"  (1.905 m)  Wt 276 lb (125.193 kg)  BMI 34.50 kg/m2  SpO2 98%  Wt Readings from Last 3 Encounters:  04/05/14 276 lb (125.193 kg)  03/19/14 268 lb (121.564 kg)  07/31/13 268 lb (121.564 kg)   Objective:   Physical Exam  Vitals Reviewed. General Appearance: Well nourished, in no apparent distress. Eyes: PERRLA, EOMs, conjunctiva no swelling or erythema Sinuses: No Frontal/maxillary tenderness ENT/Mouth: Ext aud canals clear on left and right is mildly erythematous, TM on right erythematous and TM on left non-erythematous.  Both TMs without bulging. No  erythema, swelling, or exudate on post pharynx.  Tonsils not swollen or erythematous. Hearing normal.  Neck: Supple, thyroid normal.  Respiratory: Respiratory effort normal, CTAB.  No w/r/r or stridor.  Cardio: RRR with no MRGs. Brisk peripheral pulses without edema.  Abdomen: Soft, + normal BS.  Non tender, no guarding, rebound, hernias, masses. Lymphatics: Non tender without lymphadenopathy.  Musculoskeletal: Back forward flexion painful at 45-90 degrees.  Back extension, lateral side bending and twisting intact and full ROM.  Negative straight leg test bilaterally.  Positive trendelenburg sign on right.  Full ROM, 5/5 strength, normal gait.  Skin: Warm, dry without rashes, lesions, ecchymosis.  Petechiae seen on dorsal surface of feet bilaterally.  Patient also has onychomycosis bilaterally. Neuro: Cranial nerves intact. Normal muscle tone, no cerebellar symptoms. Sensation intact.  Psych: Awake and oriented X 3, normal affect, Insight and Judgment appropriate.      Assessment & Plan:  1. Essential hypertension Continue medication as prescribed, monitor blood pressure at home. Continue DASH diet.  Reminder to go to the ER if any CP, SOB, nausea, dizziness, severe HA, changes vision/speech, left arm numbness and tingling, and jaw pain. - CBC with Differential - BASIC METABOLIC PANEL WITH GFR - Hepatic function panel  2. Prediabetes Continue diet and start exercising 120 minutes per week. Check A1C.  Might start on Metformin depending on lab results. - Hemoglobin A1c - Insulin, fasting  3. Hyperlipidemia Continue diet and start exercising 120 minutes per week. Check cholesterol. - Hepatic function panel - Lipid panel   4. Arthritis/Right sided low back pain without sciatica Take Meloxicam with food as prescribed for muscle pain - meloxicam (MOBIC) 7.5 MG tablet; Take 1 tablet (7.5 mg total) by mouth daily.  Dispense: 30 tablet; Refill: 0 -Do NOT take Mobic with any other OTC NSAIDs  (ibuprofen, Aleve or Advil) to help prevent ulcers.  5. Otitis media follow up, not resolved, right - Take Ceftin as prescribed and take with food- cefUROXime (CEFTIN) 500 MG tablet; Take 1 tablet (500 mg total) by mouth 2 (two) times daily with a meal. 10 days  Dispense: 20 tablet; Refill: 0 -Use saline nasal spray.  Do NOT use Q-Tips in ears or nose.  Take 2 sprays in each nostril at bedtime.  Make sure you spray towards the outside of each nostril towards the outer corner of your eye, hold nose close and tilt head back.    6. Vitamin D deficiency Check level and continue taking multivitamin BID. - Vit D  25 hydroxy (rtn osteoporosis monitoring)  7. Testosterone deficiency Check level and will call you with lab results.   - Testosterone  Continue diet and meds as discussed. Further disposition pending results of labs. Discussed medication effects and SE's.  Pt agreed to treatment plan. Please keep physical appt on 05/03/14 or can reschedule to February 2016 with Vicie Mutters, PA-C  Vishal Sandlin, Stephani Police, PA-C 10:31 AM [facility]

## 2014-04-05 NOTE — Patient Instructions (Addendum)
-Continue medications as prescribed. -Take Ceftin as prescribed for otitis media for 10 days -Use saline nasal spray.  Stay away from Q-Tips in nose and ears. -Take Meloxicam as prescribed with food for back pain and arthritis.  Do not take NSAIDs over the counter with it due to high risk of ulcers. -Start exercising 120 minutes per week. -Diet- low carb, lots of veggies and white meat like [place] and chicken.    Please follow up for physical in January or February 2016 the latest.   I will call you with results from blood work.    Back Pain, Adult Low back pain is very common. About 1 in 5 people have back pain.The cause of low back pain is rarely dangerous. The pain often gets better over time.About half of people with a sudden onset of back pain feel better in just 2 weeks. About 8 in 10 people feel better by 6 weeks.  CAUSES Some common causes of back pain include:  Strain of the muscles or ligaments supporting the spine.  Wear and tear (degeneration) of the spinal discs.  Arthritis.  Direct injury to the back. DIAGNOSIS Most of the time, the direct cause of low back pain is not known.However, back pain can be treated effectively even when the exact cause of the pain is unknown.Answering your caregiver's questions about your overall health and symptoms is one of the most accurate ways to make sure the cause of your pain is not dangerous. If your caregiver needs more information, he or she may order lab work or imaging tests (X-rays or MRIs).However, even if imaging tests show changes in your back, this usually does not require surgery. HOME CARE INSTRUCTIONS For many people, back pain returns.Since low back pain is rarely dangerous, it is often a condition that people can learn to [facility] their own.   Remain active. It is stressful on the back to sit or stand in one place. Do not sit, drive, or stand in one place for more than 30 minutes at a time. Take short walks on level  surfaces as soon as pain allows.Try to increase the length of time you walk each day.  Do not stay in bed.Resting more than 1 or 2 days can delay your recovery.  Do not avoid exercise or work.Your body is made to move.It is not dangerous to be active, even though your back may hurt.Your back will likely heal faster if you return to being active before your pain is gone.  Pay attention to your body when you bend and lift. Many people have less discomfortwhen lifting if they bend their knees, keep the load close to their bodies,and avoid twisting. Often, the most comfortable positions are those that put less stress on your recovering back.  Find a comfortable position to sleep. Use a firm mattress and lie on your side with your knees slightly bent. If you lie on your back, put a pillow under your knees.  Only take over-the-counter or prescription medicines as directed by your caregiver. Over-the-counter medicines to reduce pain and inflammation are often the most helpful.Your caregiver may prescribe muscle relaxant drugs.These medicines help dull your pain so you can more quickly return to your normal activities and healthy exercise.  Put ice on the injured area.  Put ice in a plastic bag.  Place a towel between your skin and the bag.  Leave the ice on for 15-20 minutes, 03-04 times a day for the first 2 to 3 days. After that,  ice and heat may be alternated to reduce pain and spasms.  Ask your caregiver about trying back exercises and gentle massage. This may be of some benefit.  Avoid feeling anxious or stressed.Stress increases muscle tension and can worsen back pain.It is important to recognize when you are anxious or stressed and learn ways to manage it.Exercise is a great option. SEEK MEDICAL CARE IF:  You have pain that is not relieved with rest or medicine.  You have pain that does not improve in 1 week.  You have new symptoms.  You are generally not feeling  well. SEEK IMMEDIATE MEDICAL CARE IF:   You have pain that radiates from your back into your legs.  You develop new bowel or bladder control problems.  You have unusual weakness or numbness in your arms or legs.  You develop nausea or vomiting.  You develop abdominal pain.  You feel faint. Document Released: 05/07/2005 Document Revised: 11/06/2011 Document Reviewed: 09/08/2013 [facility] Patient Information 2015 [place], [place]. This information is not intended to replace advice given to you by your health care provider. Make sure you discuss any questions you have with your health care provider.    Back Injury Prevention Back injuries can be extremely painful and difficult to heal. After having one back injury, you are much more likely to experience another later on. It is important to learn how to avoid injuring or re-injuring your back. The following tips can help you to prevent a back injury. PHYSICAL FITNESS  Exercise regularly and try to develop good tone in your abdominal muscles. Your abdominal muscles provide a lot of the support needed by your back.  Do aerobic exercises (walking, jogging, biking, swimming) regularly.  Do exercises that increase balance and strength (tai chi, yoga) regularly. This can decrease your risk of falling and injuring your back.  Stretch before and after exercising.  Maintain a healthy weight. The more you weigh, the more stress is placed on your back. For every pound of weight, 10 times that amount of pressure is placed on the back. DIET  Talk to your caregiver about how much calcium and vitamin D you need per day. These nutrients help to prevent weakening of the bones (osteoporosis). Osteoporosis can cause broken (fractured) bones that lead to back pain.  Include good sources of calcium in your diet, such as dairy products, green, leafy vegetables, and products with calcium added (fortified).  Include good sources of vitamin D in your diet,  such as milk and foods that are fortified with vitamin D.  Consider taking a nutritional supplement or a multivitamin if needed.  Stop smoking if you smoke. POSTURE  Sit and stand up straight. Avoid leaning forward when you sit or hunching over when you stand.  Choose chairs with good low back (lumbar) support.  If you work at a desk, sit close to your work so you do not need to lean over. Keep your chin tucked in. Keep your neck drawn back and elbows bent at a right angle. Your arms should look like the letter "L."  Sit high and close to the steering wheel when you drive. Add a lumbar support to your car seat if needed.  Avoid sitting or standing in one position for too long. Take breaks to get up, stretch, and walk around at least once every hour. Take breaks if you are driving for long periods of time.  Sleep on your side with your knees slightly bent, or sleep on your back with  a pillow under your knees. Do not sleep on your stomach. LIFTING, TWISTING, AND REACHING  Avoid heavy lifting, especially repetitive lifting. If you must do heavy lifting:  Stretch before lifting.  Work slowly.  Rest between lifts.  Use carts and dollies to move objects when possible.  Make several small trips instead of carrying 1 heavy load.  Ask for help when you need it.  Ask for help when moving big, awkward objects.  Follow these steps when lifting:  Stand with your feet shoulder-width apart.  Get as close to the object as you can. Do not try to pick up heavy objects that are far from your body.  Use handles or lifting straps if they are available.  Bend at your knees. Squat down, but keep your heels off the floor.  Keep your shoulders pulled back, your chin tucked in, and your back straight.  Lift the object slowly, tightening the muscles in your legs, abdomen, and buttocks. Keep the object as close to the center of your body as possible.  When you put a load down, use these same  guidelines in reverse.  Do not:  Lift the object above your waist.  Twist at the waist while lifting or carrying a load. Move your feet if you need to turn, not your waist.  Bend over without bending at your knees.  Avoid reaching over your head, across a table, or for an object on a high surface. OTHER TIPS  Avoid wet floors and keep sidewalks clear of ice to prevent falls.  Do not sleep on a mattress that is too soft or too hard.  Keep items that are used frequently within easy reach.  Put heavier objects on shelves at waist level and lighter objects on lower or higher shelves.  Find ways to decrease your stress, such as exercise, massage, or relaxation techniques. Stress can build up in your muscles. Tense muscles are more vulnerable to injury.  Seek treatment for depression or anxiety if needed. These conditions can increase your risk of developing back pain. SEEK MEDICAL CARE IF:  You injure your back.  You have questions about diet, exercise, or other ways to prevent back injuries. MAKE SURE YOU:  Understand these instructions.  Will watch your condition.  Will get help right away if you are not doing well or get worse. Document Released: 06/14/2004 Document Revised: 07/30/2011 Document Reviewed: 06/18/2011 [facility] Patient Information 2015 [place], [place]. This information is not intended to replace advice given to you by your health care provider. Make sure you discuss any questions you have with your health care provider.

## 2014-04-06 ENCOUNTER — Other Ambulatory Visit: Payer: Self-pay | Admitting: Physician Assistant

## 2014-04-06 DIAGNOSIS — R7303 Prediabetes: Secondary | ICD-10-CM

## 2014-04-06 LAB — HEPATIC FUNCTION PANEL
ALBUMIN: 4.1 g/dL (ref 3.5–5.2)
ALK PHOS: 66 U/L (ref 39–117)
ALT: 41 U/L (ref 0–53)
AST: 31 U/L (ref 0–37)
Bilirubin, Direct: 0.1 mg/dL (ref 0.0–0.3)
Indirect Bilirubin: 0.4 mg/dL (ref 0.2–1.2)
Total Bilirubin: 0.5 mg/dL (ref 0.2–1.2)
Total Protein: 6.6 g/dL (ref 6.0–8.3)

## 2014-04-06 LAB — CBC WITH DIFFERENTIAL/PLATELET
Basophils Absolute: 0 10*3/uL (ref 0.0–0.1)
Basophils Relative: 0 % (ref 0–1)
EOS ABS: 0.2 10*3/uL (ref 0.0–0.7)
Eosinophils Relative: 2 % (ref 0–5)
HCT: 41.6 % (ref 39.0–52.0)
Hemoglobin: 14 g/dL (ref 13.0–17.0)
LYMPHS ABS: 1.8 10*3/uL (ref 0.7–4.0)
Lymphocytes Relative: 18 % (ref 12–46)
MCH: 31.2 pg (ref 26.0–34.0)
MCHC: 33.7 g/dL (ref 30.0–36.0)
MCV: 92.7 fL (ref 78.0–100.0)
MPV: 11 fL (ref 9.4–12.4)
Monocytes Absolute: 1.1 10*3/uL — ABNORMAL HIGH (ref 0.1–1.0)
Monocytes Relative: 11 % (ref 3–12)
Neutro Abs: 7 10*3/uL (ref 1.7–7.7)
Neutrophils Relative %: 69 % (ref 43–77)
Platelets: 205 10*3/uL (ref 150–400)
RBC: 4.49 MIL/uL (ref 4.22–5.81)
RDW: 13.5 % (ref 11.5–15.5)
WBC: 10.2 10*3/uL (ref 4.0–10.5)

## 2014-04-06 LAB — LIPID PANEL
CHOL/HDL RATIO: 2.8 ratio
CHOLESTEROL: 179 mg/dL (ref 0–200)
HDL: 63 mg/dL (ref >39)
LDL Cholesterol: 102 mg/dL — ABNORMAL HIGH (ref 0–99)
Triglycerides: 69 mg/dL (ref <150)
VLDL: 14 mg/dL (ref 0–40)

## 2014-04-06 LAB — BASIC METABOLIC PANEL WITH GFR
BUN: 18 mg/dL (ref 6–23)
CO2: 27 meq/L (ref 19–32)
CREATININE: 1.07 mg/dL (ref 0.50–1.35)
Calcium: 9.1 mg/dL (ref 8.4–10.5)
Chloride: 109 mEq/L (ref 96–112)
GFR, Est African American: >89 mL/min
GFR, Est Non African American: 80 mL/min
Glucose, Bld: 108 mg/dL — ABNORMAL HIGH (ref 70–99)
Potassium: 4.2 mEq/L (ref 3.5–5.3)
Sodium: 144 mEq/L (ref 135–145)

## 2014-04-06 LAB — INSULIN, FASTING: INSULIN FASTING, SERUM: 32.6 u[IU]/mL — AB (ref 2.0–19.6)

## 2014-04-06 LAB — VITAMIN D 25 HYDROXY (VIT D DEFICIENCY, FRACTURES): Vit D, 25-Hydroxy: 25 ng/mL — ABNORMAL LOW (ref 30–100)

## 2014-04-06 LAB — HEMOGLOBIN A1C
HEMOGLOBIN A1C: 6.4 % — AB (ref <5.7)
Mean Plasma Glucose: 137 mg/dL — ABNORMAL HIGH (ref <117)

## 2014-04-06 LAB — TESTOSTERONE: Testosterone: 181 ng/dL — ABNORMAL LOW (ref 300–890)

## 2014-04-06 MED ORDER — METFORMIN HCL ER 500 MG PO TB24: ORAL_TABLET | ORAL | Status: DC

## 2014-04-17 ENCOUNTER — Other Ambulatory Visit: Payer: Self-pay | Admitting: Internal Medicine

## 2014-04-17 DIAGNOSIS — M545 Low back pain: Secondary | ICD-10-CM

## 2014-04-17 MED ORDER — CYCLOBENZAPRINE HCL 10 MG PO TABS: ORAL_TABLET | ORAL | Status: DC

## 2014-04-17 MED ORDER — PREDNISONE (PAK) 10 MG PO TABS: ORAL_TABLET | ORAL | Status: DC

## 2014-04-19 ENCOUNTER — Encounter: Payer: Self-pay | Admitting: Internal Medicine

## 2014-04-19 ENCOUNTER — Ambulatory Visit (INDEPENDENT_AMBULATORY_CARE_PROVIDER_SITE_OTHER): Payer: BC Managed Care – PPO | Admitting: Internal Medicine

## 2014-04-19 VITALS — BP 142/88 | HR 68 | Temp 97.9°F | Resp 18 | Ht 75.0 in | Wt 275.0 lb

## 2014-04-19 DIAGNOSIS — M545 Low back pain, unspecified: Secondary | ICD-10-CM

## 2014-04-19 NOTE — Progress Notes (Signed)
Subjective:    Patient ID: Dale Tapia, male    DOB: 02-22-1961, 53 y.o.   MRN: [id number]  HPI A very nicew 53 yo MWM with 2-3 week hx/o positional right lumbabo w/o sciatica who was treated over the week end with now 3rd of 5 days or prednisone & cyclobenzaprine and he reports significant improvement from 8/10 down to 2-3/10 severity. No UT sx's, sciatica and systems review is otherwise negative.  Medication Sig  . atenolol (TENORMIN) 100 MG tablet TAKE ONE TABLET BY MOUTH ONCE DAILY  . cyclobenzaprine (FLEXERIL) 10 MG tablet Take 1/2 to 1 tablet 3 x day for muscle spasms  . losartan (COZAAR) 100 MG tablet Take 1 tablet (100 mg total) by mouth daily.  . meloxicam (MOBIC) 7.5 MG tablet Take 1 tablet (7.5 mg total) by mouth daily.  . metFORMIN (GLUCOPHAGE XR) 500 MG 24 hr tablet Start taking 1 tablet PO Qdaily with largest meal.  If tolerating once a day dosing, then increase to 1 tablet PO BID with largest meals.  . Multiple Vitamins-Minerals (MULTIVITAMIN WITH MINERALS) tablet Take 1 tablet by mouth daily.  . predniSONE (STERAPRED UNI-PAK) 10 MG tablet Take 1 tablet  3 x day for inflammation  . cefUROXime (CEFTIN) 500 MG tablet Take 1 tablet (500 mg total) by mouth 2 (two) times daily with a meal. 10 days   No Known Allergies   Past Medical History  Diagnosis Date  . Hypertension   . Arthritis     neck and knees  . Allergic rhinitis, cause unspecified   . Hyperlipidemia   . Prediabetes     Review of Systems  In addition to the HPI above,  No Fever-chills,  No Headache, No changes with Vision or hearing,  No problems swallowing food or Liquids,  No Chest pain or productive Cough or Shortness of Breath,  No Abdominal pain, No Nausea or Vomitting, Bowel movements are regular,  No Blood in stool or Urine,  No dysuria,  No new skin rashes or bruises,  No new joints pains-aches,  No new weakness, tingling, numbness in any extremity,  No recent weight loss,  No polyuria,  polydypsia or polyphagia,  No significant Mental Stressors.  A full 10 point Review of Systems was done, except as stated above, all other Review of Systems were negative    Objective:   Physical Exam   BP 142/88 mmHg  Pulse 68  Temp(Src) 97.9 F (36.6 C)  Resp 18  Ht 6\' 3"  (1.905 m)  Wt 275 lb (124.739 kg)  BMI 34.37 kg/m2  HEENT - Eac's patent. TM's Nl. EOM's full. PERRLA. NasoOroPharynx clear. Neck - supple. Nl Thyroid. Carotids 2+ & No bruits, nodes, JVD Chest - Clear equal BS w/o Rales, rhonchi, wheezes. Cor - Nl HS. RRR w/o sig MGR. PP 1(+). No edema. Abd - No palpable organomegaly, masses or tenderness. BS nl. Back - (+) slight low right para-lumbar tenderness w/o spasm evident. MS- FROM w/o deformities. Muscle power, tone and bulk Nl. Gait Nl. (-) SLR & fig 4 - bilat. Neuro - No obvious Cr N abnormalities. Sensory, motor and Cerebellar functions appear Nl w/o focal abnormalities. Psyche - Mental status normal & appropriate.  No delusions, ideations or obvious mood abnormalities.    Assessment & Plan:   1) Lumbago resolving -   - continuing same conservative course.  - advised that he may return to work tonite which apparently does involve some lifting with precautions for proper body mechanics  in lifting.

## 2014-05-03 ENCOUNTER — Ambulatory Visit (INDEPENDENT_AMBULATORY_CARE_PROVIDER_SITE_OTHER): Payer: BC Managed Care – PPO | Admitting: Physician Assistant

## 2014-05-03 VITALS — BP 148/90 | HR 100 | Temp 98.1°F | Resp 16 | Wt 275.0 lb

## 2014-05-03 DIAGNOSIS — R7303 Prediabetes: Secondary | ICD-10-CM

## 2014-05-03 DIAGNOSIS — Z125 Encounter for screening for malignant neoplasm of prostate: Secondary | ICD-10-CM

## 2014-05-03 DIAGNOSIS — M545 Low back pain, unspecified: Secondary | ICD-10-CM

## 2014-05-03 DIAGNOSIS — Z0001 Encounter for general adult medical examination with abnormal findings: Secondary | ICD-10-CM

## 2014-05-03 DIAGNOSIS — J309 Allergic rhinitis, unspecified: Secondary | ICD-10-CM

## 2014-05-03 DIAGNOSIS — R6889 Other general symptoms and signs: Secondary | ICD-10-CM

## 2014-05-03 DIAGNOSIS — E785 Hyperlipidemia, unspecified: Secondary | ICD-10-CM

## 2014-05-03 DIAGNOSIS — E669 Obesity, unspecified: Secondary | ICD-10-CM

## 2014-05-03 DIAGNOSIS — M1990 Unspecified osteoarthritis, unspecified site: Secondary | ICD-10-CM

## 2014-05-03 DIAGNOSIS — I10 Essential (primary) hypertension: Secondary | ICD-10-CM

## 2014-05-03 MED ORDER — CYCLOBENZAPRINE HCL 10 MG PO TABS: ORAL_TABLET | ORAL | Status: DC

## 2014-05-03 MED ORDER — MELOXICAM 15 MG PO TABS: 15.0000 mg | ORAL_TABLET | Freq: Every day | ORAL | Status: DC

## 2014-05-03 MED ORDER — ATENOLOL 100 MG PO TABS: 100.0000 mg | ORAL_TABLET | Freq: Every day | ORAL | Status: DC

## 2014-05-03 NOTE — Patient Instructions (Addendum)
Bad carbs also include fruit juice, alcohol, and sweet tea. These are empty calories that do not signal to your brain that you are full.   Please remember the good carbs are still carbs which convert into sugar. So please measure them out no more than 1/2-1 cup of rice, oatmeal, pasta, and beans  Veggies are however free foods! Pile them on.   Not all fruit is created equal. Please see the list below, the fruit at the bottom is higher in sugars than the fruit at the top. Please avoid all dried fruits.     We want weight loss that will last so you should lose 1-2 pounds a week.  THAT IS IT! Please pick THREE things a month to change. Once it is a habit check off the item. Then pick another three items off the list to become habits.  If you are already doing a habit on the list GREAT!  Cross that item off! o Don't drink your calories. Ie, alcohol, soda, fruit juice, and sweet tea.  o Drink more water. Drink a glass when you feel hungry or before each meal.  o Eat breakfast - Complex carb and protein (likeDannon light and fit yogurt, oatmeal, fruit, eggs, [place] bacon). o Measure your cereal.  Eat no more than one cup a day. (ie [place]) o Eat an apple a day. o Add a vegetable a day. o Try a new vegetable a month. o Use Pam! Stop using oil or butter to cook. o Don't finish your plate or use smaller plates. o Share your dessert. o Eat sugar free Jello for dessert or frozen grapes. o Don't eat 2-3 hours before bed. o Switch to whole wheat bread, pasta, and brown rice. o Make healthier choices when you eat out. No fries! o Pick baked chicken, NOT fried. o Don't forget to SLOW DOWN when you eat. It is not going anywhere.  o Take the stairs. o Park far away in the parking lot o [employer] (or weights) for 10 minutes while watching TV. o Walk at work for 10 minutes during break. o Walk outside 1 time a week with your friend, kids, dog, or significant other. o Start a walking group at  [place] the mall as much as you can tolerate.  o Keep a food diary. o Weigh yourself daily. o Walk for 15 minutes 3 days per week. o Cook at home more often and eat out less.  If life happens and you go back to old habits, it is okay.  Just start over. You can do it!   If you experience chest pain, get short of breath, or tired during the exercise, please stop immediately and inform your doctor.   9 Ways to Naturally Increase Testosterone Levels  1.   Lose Weight If you're overweight, shedding the excess pounds may increase your testosterone levels, according to research presented at the Endocrine Society's 2012 meeting. Overweight men are more likely to have low testosterone levels to begin with, so this is an important trick to increase your body's testosterone production when you need it most.  2.   High-Intensity Exercise like Peak Fitness  Short intense exercise has a proven positive effect on increasing testosterone levels and preventing its decline. That's unlike aerobics or prolonged moderate exercise, which have shown to have negative or no effect on testosterone levels. Having a whey protein meal after exercise can further enhance the satiety/testosterone-boosting impact (hunger hormones cause the opposite  effect on your testosterone and libido). Here's a summary of what a typical high-intensity Peak Fitness routine might look like: " Warm up for three minutes  " Exercise as hard and fast as you can for 30 seconds. You should feel like you couldn't possibly go on another few seconds  " Recover at a slow to moderate pace for 90 seconds  " Repeat the high intensity exercise and recovery 7 more times .  3.   Consume Plenty of Zinc The mineral zinc is important for testosterone production, and supplementing your diet for as little as six weeks has been shown to cause a marked improvement in testosterone among men with low levels.1 Likewise, research has shown that restricting  dietary sources of zinc leads to a significant decrease in testosterone, while zinc supplementation increases it2 -- and even protects men from exercised-induced reductions in testosterone levels.3 It's estimated that up to 29 percent of adults over the age of 60 may have lower than recommended zinc intakes; even when dietary supplements were added in, an estimated 20-25 percent of older adults still had inadequate zinc intakes, according to a [employer] and Nutrition Examination Survey.4 Your diet is the best source of zinc; along with protein-rich foods like meats and fish, other good dietary sources of zinc include raw milk, raw cheese, beans, and yogurt or kefir made from raw milk. It can be difficult to obtain enough dietary zinc if you're a vegetarian, and also for meat-eaters as well, largely because of conventional farming methods that rely heavily on chemical fertilizers and pesticides. These chemicals deplete the soil of nutrients ... nutrients like zinc that must be absorbed by plants in order to be passed on to you. In many cases, you may further deplete the nutrients in your food by the way you prepare it. For most food, cooking it will drastically reduce its levels of nutrients like zinc . particularly over-cooking, which many people do. If you decide to use a zinc supplement, stick to a dosage of less than 40 mg a day, as this is the recommended adult upper limit. Taking too much zinc can interfere with your body's ability to absorb other minerals, especially copper, and may cause nausea as a side effect.  4.   Strength Training In addition to [employer] Fitness, strength training is also known to boost testosterone levels, provided you are doing so intensely enough. When strength training to boost testosterone, you'll want to increase the weight and lower your number of reps, and then focus on exercises that work a large number of muscles, such as dead lifts or squats.  You can "turbo-charge"  your weight training by going slower. By slowing down your movement, you're actually turning it into a high-intensity exercise. Super Slow movement allows your muscle, at the microscopic level, to access the maximum number of cross-bridges between the protein filaments that produce movement in the muscle.   5.   Optimize Your Vitamin D Levels Vitamin D, a steroid hormone, is essential for the healthy development of the nucleus of the sperm cell, and helps maintain semen quality and sperm count. Vitamin D also increases levels of testosterone, which may boost libido. In one study, overweight men who were given vitamin D supplements had a significant increase in testosterone levels after one year.5   6.   Reduce Stress When you're under a lot of stress, your body releases high levels of the stress hormone cortisol. This hormone actually blocks the effects of testosterone,6 presumably because,  from a biological standpoint, testosterone-associated behaviors (mating, competing, aggression) may have lowered your chances of survival in an emergency (hence, the "fight or flight" response is dominant, courtesy of cortisol).  7.   Limit or Eliminate Sugar from Your Diet Testosterone levels decrease after you eat sugar, which is likely because the sugar leads to a high insulin level, another factor leading to low testosterone.7 Based on USDA estimates, the average American consumes 12 teaspoons of sugar a day, which equates to about TWO TONS of sugar during a lifetime.  8.   Eat Healthy Fats By healthy, this means not only mon- and polyunsaturated fats, like that found in avocadoes and nuts, but also saturated, as these are essential for building testosterone. Research shows that a diet with less than 40 percent of energy as fat (and that mainly from animal sources, i.e. saturated) lead to a decrease in testosterone levels.8 My personal diet is about 60-70 percent healthy fat, and other experts agree that the  ideal diet includes somewhere between 50-70 percent fat.  It's important to understand that your body requires saturated fats from animal and vegetable sources (such as meat, dairy, certain oils, and tropical plants like coconut) for optimal functioning, and if you neglect this important food group in favor of sugar, grains and other starchy carbs, your health and weight are almost guaranteed to suffer. Examples of healthy fats you can eat more of to give your testosterone levels a boost include: Olives and Olive oil  Coconuts and coconut oil Butter made from raw grass-fed organic milk Raw nuts, such as, almonds or pecans Organic pastured egg yolks Avocados Grass-fed meats Palm oil Unheated organic nut oils   9.   Boost Your Intake of Branch Chain Amino Acids (BCAA) from Foods Like [place] suggests that BCAAs result in higher testosterone levels, particularly when taken along with resistance training.9 While BCAAs are available in supplement form, you'll find the highest concentrations of BCAAs like leucine in dairy products - especially quality cheeses and whey protein. Even when getting leucine from your natural food supply, it's often wasted or used as a building block instead of an anabolic agent. So to create the correct anabolic environment, you need to boost leucine consumption way beyond mere maintenance levels. That said, keep in mind that using leucine as a free form amino acid can be highly counterproductive as when free form amino acids are artificially administrated, they rapidly enter your circulation while disrupting insulin function, and impairing your body's glycemic control. Food-based leucine is really the ideal form that can benefit your muscles without side effects.

## 2014-05-03 NOTE — Progress Notes (Signed)
Complete Physical  Assessment and Plan: 1. Essential hypertension - continue medications, DASH diet, exercise and monitor at home. Call if greater than 130/80.  - EKG 12-Lead - [place], RETROPERITNL ABD,  LTD  2. Allergic rhinitis, unspecified allergic rhinitis type Continue OTC allergy pills  3. Prediabetes Discussed general issues about diabetes pathophysiology and management., Educational material distributed., Suggested low cholesterol diet., Encouraged aerobic exercise., Discussed foot care., Reminded to get yearly retinal exam. - HM DIABETES FOOT EXAM  4. Arthritis Weight loss advised, try mobic WITH food, exercises given - meloxicam (MOBIC) 15 MG tablet; Take 1 tablet (15 mg total) by mouth daily. With food for pain as needed  Dispense: 30 tablet; Refill: 0  5. Hyperlipidemia -continue medications, check lipids, decrease fatty foods, increase activity.   6. Obesity Obesity with co morbidities- long discussion about weight loss, diet, and exercise  7. Encounter for general adult medical examination with abnormal findings - Urinalysis, Routine w reflex microscopic - Microalbumin / creatinine urine ratio - Vitamin B12 - Iron and TIBC - Ferritin  8. Prostate cancer screening - PSA  9. Low back pain without sciatica, unspecified back pain laterality - cyclobenzaprine (FLEXERIL) 10 MG tablet; Take 1/2 to 1 tablet 3 x day for muscle spasms  Dispense: 60 tablet; Refill: 3  10. Right-sided low back pain without sciatica - meloxicam (MOBIC) 15 MG tablet; Take 1 tablet (15 mg total) by mouth daily. With food for pain as needed  Dispense: 30 tablet; Refill: 0  11. Hypogonadism-  Weight loss advised, will monitor  Discussed med's effects and SE's. Screening labs and tests as requested with regular follow-up as recommended.  HPI Patient presents for a complete physical.    His blood pressure has not been controlled at home, he is on atenolol 100 and cozaar 100, however he has  been out of the atenolol for 5-10 days, he has been taking prednisone, for pain, today their BP is BP: (!) 148/90 mmHg He does not workout. He denies chest pain, shortness of breath, dizziness.  He is not on cholesterol medication and denies myalgias. His cholesterol is at goal. The cholesterol last visit was:   Lab Results  Component Value Date   CHOL 179 04/05/2014   HDL 63 04/05/2014   LDLCALC 102* 04/05/2014   TRIG 69 04/05/2014   CHOLHDL 2.8 04/05/2014   He has been working on diet and exercise for prediabetes, insulin was 106 last visit, he is on Metformin 500mg  BID, he is not on bASA, he is on ACE/ARB and denies paresthesia of the feet, polydipsia, polyuria and visual disturbances. Last A1C in the office was:  Lab Results  Component Value Date   HGBA1C 6.4* 04/05/2014   He was on prednisone in Nov 30th for right lower back pain which has helped, he has not been taking the mobic. Patient is on Vitamin D supplement, he is on a MVIT but a vitamin D..   Lab Results  Component Value Date   VD25OH 25* 04/05/2014     Last PSA was:0.50 BMI is Body mass index is 34.37 kg/(m^2)., he is working on diet and exercise. Wt Readings from Last 3 Encounters:  05/03/14 275 lb (124.739 kg)  04/19/14 275 lb (124.739 kg)  04/05/14 276 lb (125.193 kg)   He has a history of testosterone deficiency and is on OTC testosterone, which he increased to 2 pills since last visit. 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 His wife is doing a weight loss program and he would like to start it.   Current Medications:  Current Outpatient Prescriptions on File Prior to Visit  Medication Sig Dispense Refill  . atenolol (TENORMIN) 100 MG tablet TAKE ONE TABLET BY MOUTH ONCE DAILY 90 tablet 0  . cyclobenzaprine (FLEXERIL) 10 MG tablet Take 1/2 to 1 tablet 3 x day for muscle spasms 15 tablet 0  . losartan (COZAAR)  100 MG tablet Take 1 tablet (100 mg total) by mouth daily. 90 tablet 1  . meloxicam (MOBIC) 7.5 MG tablet Take 1 tablet (7.5 mg total) by mouth daily. 30 tablet 0  . metFORMIN (GLUCOPHAGE XR) 500 MG 24 hr tablet Start taking 1 tablet PO Qdaily with largest meal.  If tolerating once a day dosing, then increase to 1 tablet PO BID with largest meals. 60 tablet 1  . Multiple Vitamins-Minerals (MULTIVITAMIN WITH MINERALS) tablet Take 1 tablet by mouth daily.    . predniSONE (STERAPRED UNI-PAK) 10 MG tablet Take 1 tablet  3 x day for inflammation 15 tablet 0   No current facility-administered medications on file prior to visit.   Health Maintenance:  Immunization History  Administered Date(s) Administered  . Td 06/12/2005  . Tdap 01/18/2012   TDAP: 2013 Pneumovax: 1996 Flu vaccine: Zostavax: DEXA: Colonoscopy: 02/2013 Dr. Henrene Pastor EGD: Sleep study negative 2011  Allergies: No Known Allergies Medical History:  Past Medical History  Diagnosis Date  . Hypertension   . Arthritis     neck and knees  . Allergic rhinitis, cause unspecified   . Hyperlipidemia   . Prediabetes    Surgical History: No past surgical history on file. Family History:  Family History  Problem Relation Age of Onset  . Colon cancer Neg Hx   . Hypertension Mother   . Arthritis Mother   . Sleep apnea Mother   . Hypertension Father   . Kidney disease Father    Social History:   History  Substance Use Topics  . Smoking status: Never Smoker   . Smokeless tobacco: Never Used  . Alcohol Use: No    Review of Systems:  Review of Systems  Constitutional: Negative.   HENT: Positive for congestion and tinnitus. Negative for ear discharge, ear pain, hearing loss, nosebleeds and sore throat.   Eyes: Negative.   Respiratory: Negative.  Negative for stridor.   Cardiovascular: Negative.   Gastrointestinal: Positive for heartburn (depending on food).  Genitourinary: Negative.   Musculoskeletal: Positive for back  pain (lower back) and joint pain (bilateral knees). Negative for myalgias, falls and neck pain.  Skin: Negative.   Neurological: Negative.  Negative for headaches.  Endo/Heme/Allergies: Negative.   Psychiatric/Behavioral: Negative.     Physical Exam: Estimated body mass index is 34.37 kg/(m^2) as calculated from the following:   Height as of 04/19/14: 6\' 3"  (1.905 m).   Weight as of this encounter: 275 lb (124.739 kg). BP 148/90 mmHg  Pulse 100  Temp(Src) 98.1 F (36.7 C)  Resp 16  Wt 275 lb (124.739 kg) General Appearance: Well nourished, in no apparent distress.  Eyes: PERRLA, EOMs, conjunctiva no swelling or erythema, normal fundi and vessels.  Sinuses: No Frontal/maxillary tenderness  ENT/Mouth: Ext aud canals clear, normal light reflex with TMs without erythema, bulging. Good dentition. No erythema, swelling, or exudate on post pharynx. Tonsils not swollen or erythematous. Hearing normal.  Neck: Supple, thyroid normal. No bruits  Respiratory: Respiratory  effort normal, BS equal bilaterally without rales, rhonchi, wheezing or stridor.  Cardio: RRR without murmurs, rubs or gallops. Brisk peripheral pulses without edema.  Chest: symmetric, with normal excursions and percussion.  Abdomen: Soft, nontender, no guarding, rebound, hernias, masses, or organomegaly. .  Lymphatics: Non tender without lymphadenopathy.  Genitourinary: defer Musculoskeletal: Full ROM all peripheral extremities,5/5 strength, and normal gait.  Skin: Warm, dry without rashes, lesions, ecchymosis. Neuro: Cranial nerves intact, reflexes equal bilaterally. Normal muscle tone, no cerebellar symptoms. Sensation intact.  Psych: Awake and oriented X 3, normal affect, Insight and Judgment appropriate.   EKG: WNL no changes. AORTA SCAN: WNL  Vicie Mutters 10:31 AM [facility]

## 2014-05-04 LAB — URINALYSIS, MICROSCOPIC ONLY
BACTERIA UA: NONE SEEN
CASTS: NONE SEEN
CRYSTALS: NONE SEEN
Squamous Epithelial / LPF: NONE SEEN

## 2014-05-04 LAB — VITAMIN B12: VITAMIN B 12: 387 pg/mL (ref 211–911)

## 2014-05-04 LAB — URINALYSIS, ROUTINE W REFLEX MICROSCOPIC
BILIRUBIN URINE: NEGATIVE
Glucose, UA: NEGATIVE mg/dL
Hgb urine dipstick: NEGATIVE
Ketones, ur: NEGATIVE mg/dL
Leukocytes, UA: NEGATIVE
Nitrite: NEGATIVE
PH: 5.5 (ref 5.0–8.0)
Protein, ur: NEGATIVE mg/dL
SPECIFIC GRAVITY, URINE: 1.026 (ref 1.005–1.030)
Urobilinogen, UA: 0.2 mg/dL (ref 0.0–1.0)

## 2014-05-04 LAB — IRON AND TIBC
%SAT: 15 % — ABNORMAL LOW (ref 20–55)
Iron: 48 ug/dL (ref 42–165)
TIBC: 313 ug/dL (ref 215–435)
UIBC: 265 ug/dL (ref 125–400)

## 2014-05-04 LAB — FERRITIN: Ferritin: 338 ng/mL — ABNORMAL HIGH (ref 22–322)

## 2014-05-04 LAB — PSA: PSA: 0.8 ng/mL (ref <=4.00)

## 2014-05-04 LAB — MICROALBUMIN / CREATININE URINE RATIO
CREATININE, URINE: 238.3 mg/dL
MICROALB UR: 0.6 mg/dL (ref <2.0)
MICROALB/CREAT RATIO: 2.5 mg/g (ref 0.0–30.0)

## 2014-09-03 ENCOUNTER — Other Ambulatory Visit: Payer: Self-pay | Admitting: Physician Assistant

## 2014-09-08 ENCOUNTER — Other Ambulatory Visit: Payer: Self-pay | Admitting: *Deleted

## 2014-09-08 DIAGNOSIS — R7303 Prediabetes: Secondary | ICD-10-CM

## 2014-09-08 MED ORDER — ATENOLOL 100 MG PO TABS: 100.0000 mg | ORAL_TABLET | Freq: Every day | ORAL | Status: DC

## 2014-09-08 MED ORDER — LOSARTAN POTASSIUM 100 MG PO TABS: 100.0000 mg | ORAL_TABLET | Freq: Every day | ORAL | Status: DC

## 2014-10-08 ENCOUNTER — Other Ambulatory Visit: Payer: Self-pay | Admitting: Internal Medicine

## 2014-10-08 DIAGNOSIS — M1990 Unspecified osteoarthritis, unspecified site: Secondary | ICD-10-CM

## 2014-10-08 DIAGNOSIS — M545 Low back pain, unspecified: Secondary | ICD-10-CM

## 2014-10-08 MED ORDER — MELOXICAM 15 MG PO TABS: 15.0000 mg | ORAL_TABLET | Freq: Every day | ORAL | Status: AC

## 2014-10-08 MED ORDER — CYCLOBENZAPRINE HCL 10 MG PO TABS: ORAL_TABLET | ORAL | Status: AC

## 2015-01-17 ENCOUNTER — Encounter: Payer: Self-pay | Admitting: Internal Medicine

## 2015-01-17 ENCOUNTER — Ambulatory Visit (INDEPENDENT_AMBULATORY_CARE_PROVIDER_SITE_OTHER): Payer: BLUE CROSS/BLUE SHIELD | Admitting: Internal Medicine

## 2015-01-17 VITALS — BP 136/80 | HR 66 | Temp 98.2°F | Resp 18 | Ht 75.0 in | Wt 270.0 lb

## 2015-01-17 DIAGNOSIS — R109 Unspecified abdominal pain: Secondary | ICD-10-CM

## 2015-01-17 MED ORDER — CYCLOBENZAPRINE HCL 10 MG PO TABS: 10.0000 mg | ORAL_TABLET | Freq: Three times a day (TID) | ORAL | Status: DC | PRN

## 2015-01-17 MED ORDER — TRAMADOL HCL 50 MG PO TABS: 50.0000 mg | ORAL_TABLET | Freq: Four times a day (QID) | ORAL | Status: AC | PRN

## 2015-01-17 NOTE — Patient Instructions (Signed)
Back Pain, Adult Low back pain is very common. About 1 in 5 people have back pain.The cause of low back pain is rarely dangerous. The pain often gets better over time.About half of people with a sudden onset of back pain feel better in just 2 weeks. About 8 in 10 people feel better by 6 weeks.  CAUSES Some common causes of back pain include:  Strain of the muscles or ligaments supporting the spine.  Wear and tear (degeneration) of the spinal discs.  Arthritis.  Direct injury to the back. DIAGNOSIS Most of the time, the direct cause of low back pain is not known.However, back pain can be treated effectively even when the exact cause of the pain is unknown.Answering your caregiver's questions about your overall health and symptoms is one of the most accurate ways to make sure the cause of your pain is not dangerous. If your caregiver needs more information, he or she may order lab work or imaging tests (X-rays or MRIs).However, even if imaging tests show changes in your back, this usually does not require surgery. HOME CARE INSTRUCTIONS For many people, back pain returns.Since low back pain is rarely dangerous, it is often a condition that people can learn to manageon their own.   Remain active. It is stressful on the back to sit or stand in one place. Do not sit, drive, or stand in one place for more than 30 minutes at a time. Take short walks on level surfaces as soon as pain allows.Try to increase the length of time you walk each day.  Do not stay in bed.Resting more than 1 or 2 days can delay your recovery.  Do not avoid exercise or work.Your body is made to move.It is not dangerous to be active, even though your back may hurt.Your back will likely heal faster if you return to being active before your pain is gone.  Pay attention to your body when you bend and lift. Many people have less discomfortwhen lifting if they bend their knees, keep the load close to their bodies,and  avoid twisting. Often, the most comfortable positions are those that put less stress on your recovering back.  Find a comfortable position to sleep. Use a firm mattress and lie on your side with your knees slightly bent. If you lie on your back, put a pillow under your knees.  Only take over-the-counter or prescription medicines as directed by your caregiver. Over-the-counter medicines to reduce pain and inflammation are often the most helpful.Your caregiver may prescribe muscle relaxant drugs.These medicines help dull your pain so you can more quickly return to your normal activities and healthy exercise.  Put ice on the injured area.  Put ice in a plastic bag.  Place a towel between your skin and the bag.  Leave the ice on for 15-20 minutes, 03-04 times a day for the first 2 to 3 days. After that, ice and heat may be alternated to reduce pain and spasms.  Ask your caregiver about trying back exercises and gentle massage. This may be of some benefit.  Avoid feeling anxious or stressed.Stress increases muscle tension and can worsen back pain.It is important to recognize when you are anxious or stressed and learn ways to manage it.Exercise is a great option. SEEK MEDICAL CARE IF:  You have pain that is not relieved with rest or medicine.  You have pain that does not improve in 1 week.  You have new symptoms.  You are generally not feeling well. SEEK   IMMEDIATE MEDICAL CARE IF:   You have pain that radiates from your back into your legs.  You develop new bowel or bladder control problems.  You have unusual weakness or numbness in your arms or legs.  You develop nausea or vomiting.  You develop abdominal pain.  You feel faint. Document Released: 05/07/2005 Document Revised: 11/06/2011 Document Reviewed: 09/08/2013 [employer] Patient Information 2015 [employer]. This information is not intended to replace advice given to you by your health care provider. Make sure you  discuss any questions you have with your health care provider.  

## 2015-01-17 NOTE — Progress Notes (Signed)
   Subjective:    Patient ID: Dale Tapia, male    DOB: 08/19/1960, 54 y.o.   MRN: [id number]  Flank Pain Pertinent negatives include no dysuria or numbness.   Patient presents to the office for evaluation of right sided flank pain x 3 days.  He reports that he is currently having spasming and tightness in the right flank.  He reports that the pain is worse with movement.  He felt like it was trying to radiate across his back to the left side while he was working today.  He has been taking ibuprofen at work and also sometimes uses flexeril.  He feels like this doesn't help a whole lot.  He reports that he has never had pain like this in the past and no injury he can think of.  He has no history of kidney stones.  He reports that he normally drinks 3 huge water bottles per day.  He has no family history of kidney stones.     Review of Systems  Constitutional: Negative for chills and fatigue.  Gastrointestinal: Negative for nausea and vomiting.  Genitourinary: Positive for flank pain. Negative for dysuria, urgency, frequency, hematuria and difficulty urinating.  Musculoskeletal: Positive for back pain.  Neurological: Negative for dizziness and numbness.       Objective:   Physical Exam  Constitutional: He is oriented to person, place, and time. He appears well-developed and well-nourished. No distress.  HENT:  Head: Normocephalic.  Mouth/Throat: Oropharynx is clear and moist. No oropharyngeal exudate.  Eyes: Conjunctivae are normal. No scleral icterus.  Neck: Normal range of motion. Neck supple. No JVD present. No thyromegaly present.  Cardiovascular: Normal rate, regular rhythm, normal heart sounds and intact distal pulses.  Exam reveals no gallop and no friction rub.   No murmur heard. Pulmonary/Chest: Effort normal and breath sounds normal. No respiratory distress. He has no wheezes. He has no rales. He exhibits no tenderness.  Abdominal: Soft. Bowel sounds are normal. He exhibits  no distension and no mass. There is no tenderness. There is no rebound, no guarding and no CVA tenderness.  Musculoskeletal:  Patient rises slowly from sitting to standing.  They walk without an antalgic gait.  There is no evidence of erythema, ecchymosis, or gross deformity.  There is tenderness to palpation over right sided thoracic paraspinal muscles.  Active ROM is full with mild pain.  Sensation to light touch is intact over all extremities.  Strength is symmetric and equal in all extremities.    Lymphadenopathy:    He has no cervical adenopathy.  Neurological: He is alert and oriented to person, place, and time.  Skin: Skin is warm and dry. He is not diaphoretic.  Psychiatric: He has a normal mood and affect. His behavior is normal. Judgment and thought content normal.  Nursing note and vitals reviewed.         Assessment & Plan:    1. Right flank pain  - Urinalysis, Routine w reflex microscopic (not at [facility]) - Culture, Urine - cyclobenzaprine (FLEXERIL) 10 MG tablet; Take 1 tablet (10 mg total) by mouth every 8 (eight) hours as needed for muscle spasms.  Dispense: 30 tablet; Refill: 1 - traMADol (ULTRAM) 50 MG tablet; Take 1 tablet (50 mg total) by mouth every 6 (six) hours as needed.  Dispense: 50 tablet; Refill: 0

## 2015-01-18 LAB — URINALYSIS, ROUTINE W REFLEX MICROSCOPIC
Bilirubin Urine: NEGATIVE
GLUCOSE, UA: NEGATIVE
Hgb urine dipstick: NEGATIVE
KETONES UR: NEGATIVE
LEUKOCYTES UA: NEGATIVE
Nitrite: NEGATIVE
PH: 5 (ref 5.0–8.0)
Protein, ur: NEGATIVE
Specific Gravity, Urine: 1.026 (ref 1.001–1.035)

## 2015-01-19 LAB — URINE CULTURE
COLONY COUNT: NO GROWTH
Organism ID, Bacteria: NO GROWTH

## 2015-01-31 ENCOUNTER — Encounter: Payer: Self-pay | Admitting: Physician Assistant

## 2015-01-31 ENCOUNTER — Ambulatory Visit (INDEPENDENT_AMBULATORY_CARE_PROVIDER_SITE_OTHER): Payer: BLUE CROSS/BLUE SHIELD | Admitting: Physician Assistant

## 2015-01-31 VITALS — BP 128/90 | HR 64 | Temp 97.7°F | Resp 18 | Ht 75.0 in | Wt 268.0 lb

## 2015-01-31 DIAGNOSIS — J309 Allergic rhinitis, unspecified: Secondary | ICD-10-CM | POA: Diagnosis not present

## 2015-01-31 DIAGNOSIS — R059 Cough, unspecified: Secondary | ICD-10-CM

## 2015-01-31 DIAGNOSIS — R05 Cough: Secondary | ICD-10-CM | POA: Diagnosis not present

## 2015-01-31 MED ORDER — AZITHROMYCIN 250 MG PO TABS: ORAL_TABLET | ORAL | Status: AC

## 2015-01-31 MED ORDER — ALBUTEROL SULFATE HFA 108 (90 BASE) MCG/ACT IN AERS: 2.0000 | INHALATION_SPRAY | RESPIRATORY_TRACT | Status: DC | PRN

## 2015-01-31 MED ORDER — ATENOLOL 100 MG PO TABS: 100.0000 mg | ORAL_TABLET | Freq: Every day | ORAL | Status: DC

## 2015-01-31 MED ORDER — FLUTICASONE PROPIONATE 50 MCG/ACT NA SUSP: 2.0000 | Freq: Every day | NASAL | Status: DC

## 2015-01-31 MED ORDER — PREDNISONE 20 MG PO TABS: ORAL_TABLET | ORAL | Status: DC

## 2015-01-31 NOTE — Patient Instructions (Signed)
Sinusitis can be uncomfortable. People with sinusitis have congestion with yellow/green/gray discharge, sinus pain/pressure, pain around the eyes. Sinus infections almost ALWAYS stem from a viral infection and antibiotics don't work against a virus. Even when bacteria is responsible, the infections usually clear up on their own in a week or so.   PLEASE TRY TO DO OVER THE COUNTER TREATMENT AND PREDNISONE FOR 5-7 DAYS AND IF YOU ARE NOT GETTING BETTER OR GETTING WORSE THEN YOU CAN START ON AN ANTIBIOTIC GIVEN.  Can take the prednisone AT NIGHT WITH DINNER, it take 8-12 hours to start working so it will NOT affect your sleeping if you take it at night with your food!! Take two pills the first night and 1 or two pill the second night and then 1 pill the other nights.   Risk of antibiotic use: About 1 in 4 people who take antibiotics have side effects including stomach problems, dizziness, or rashes. Those problems clear up soon after stopping the drugs, but in rare cases antibiotics can cause severe allergic reaction. Over use of antibiotics also encourages the growth of bacteria that can't be controlled easily with drugs. That makes you more vunerable to antibiotic-resistant infections and undermines the benefits of antibiotics for others.   Waste of Money: Antibiotics often aren't very expensive, but any money spent on unnecessary drugs is money down the drain.   When are antibiotics needed? Only when symptoms last longer than a week.  Start to improve but then worsen again  -It can take up to 2 weeks to feel better.   -If you do not get better in 7-10 days (Have fever, facial pain, dental pain and swelling), then please call the office and it is now appropriate to start an antibiotic.   -Please take Tylenol or Ibuprofen for pain. -Acetaminiphen 325mg orally every 4-6 hours for pain.  Max: 10 per day -Ibuprofen 200mg orally every 6-8 hours for pain.  Take with food to avoid ulcers.   Max 10 per  day  Please pick one of the over the counter allergy medications below and take it once daily for allergies.  Claritin or loratadine cheapest but likely the weakest  Zyrtec or certizine at night because it can make you sleepy The strongest is allegra or fexafinadine  Cheapest at [employer], [employer], [employer]  -While drinking fluids, pinch and hold nose close and swallow.  This will help open up your eustachian tubes to drain the fluid behind your ear drums. -Try steam showers to open your nasal passages.   Drink lots of water to stay hydrated and to thin mucous.  Flonase/Nasonex is to help the inflammation.  Take 2 sprays in each nostril at bedtime.  Make sure you spray towards the outside of each nostril towards the outer corner of your eye, hold nose close and tilt head back.  This will help the medication get into your sinuses.  If you do not like this medication, then use saline nasal sprays same directions as above for Flonase. Stop the medication right away if you get blurring of your vision or nose bleeds.  Sinusitis Sinusitis is redness, soreness, and inflammation of the paranasal sinuses. Paranasal sinuses are air pockets within the bones of your face (beneath the eyes, the middle of the forehead, or above the eyes). In healthy paranasal sinuses, mucus is able to drain out, and air is able to circulate through them by way of your nose. However, when your paranasal sinuses are inflamed, mucus and air can   become trapped. This can allow bacteria and other germs to grow and cause infection. Sinusitis can develop quickly and last only a short time (acute) or continue over a long period (chronic). Sinusitis that lasts for more than 12 weeks is considered chronic.  CAUSES  Causes of sinusitis include: Allergies. Structural abnormalities, such as displacement of the cartilage that separates your nostrils (deviated septum), which can decrease the air flow through your nose and sinuses and affect sinus  drainage. Functional abnormalities, such as when the small hairs (cilia) that line your sinuses and help remove mucus do not work properly or are not present. SIGNS AND SYMPTOMS  Symptoms of acute and chronic sinusitis are the same. The primary symptoms are pain and pressure around the affected sinuses. Other symptoms include: Upper toothache. Earache. Headache. Bad breath. Decreased sense of smell and taste. A cough, which worsens when you are lying flat. Fatigue. Fever. Thick drainage from your nose, which often is green and may contain pus (purulent). Swelling and warmth over the affected sinuses. DIAGNOSIS  Your health care provider will perform a physical exam. During the exam, your health care provider may: Look in your nose for signs of abnormal growths in your nostrils (nasal polyps).  Tap over the affected sinus to check for signs of infection. View the inside of your sinuses (endoscopy) using an imaging device that has a light attached (endoscope). If your health care provider suspects that you have chronic sinusitis, one or more of the following tests may be recommended: Allergy tests. Nasal culture. A sample of mucus is taken from your nose, sent to a lab, and screened for bacteria. Nasal cytology. A sample of mucus is taken from your nose and examined by your health care provider to determine if your sinusitis is related to an allergy. TREATMENT  Most cases of acute sinusitis are related to a viral infection and will resolve on their own within 10 days. Sometimes medicines are prescribed to help relieve symptoms (pain medicine, decongestants, nasal steroid sprays, or saline sprays).  However, for sinusitis related to a bacterial infection, your health care provider will prescribe antibiotic medicines. These are medicines that will help kill the bacteria causing the infection.  Rarely, sinusitis is caused by a fungal infection. In theses cases, your health care provider will  prescribe antifungal medicine. For some cases of chronic sinusitis, surgery is needed. Generally, these are cases in which sinusitis recurs more than 3 times per year, despite other treatments. HOME CARE INSTRUCTIONS  Drink plenty of water. Water helps thin the mucus so your sinuses can drain more easily. Use a humidifier. Inhale steam 3 to 4 times a day (for example, sit in the bathroom with the shower running). Apply a warm, moist washcloth to your face 3 to 4 times a day, or as directed by your health care provider. Use saline nasal sprays to help moisten and clean your sinuses. Take medicines only as directed by your health care provider. If you were prescribed either an antibiotic or antifungal medicine, finish it all even if you start to feel better. SEEK IMMEDIATE MEDICAL CARE IF: You have increasing pain or severe headaches. You have nausea, vomiting, or drowsiness. You have swelling around your face. You have vision problems. You have a stiff neck. You have difficulty breathing. MAKE SURE YOU:  Understand these instructions. Will watch your condition. Will get help right away if you are not doing well or get worse. Document Released: 05/07/2005 Document Revised: 09/21/2013 Document Reviewed: 05/22/2011 Exit[employer]   Patient Information 2015 [employer]. This information is not intended to replace advice given to you by your health care provider. Make sure you discuss any questions you have with your health care provider.   

## 2015-01-31 NOTE — Progress Notes (Signed)
Assessment and Plan:  1. Hypertension -Continue medication, can stay off cozaar and continue atenolol, monitor BP, monitor blood pressure at home. Continue DASH diet.  Reminder to go to the ER if any CP, SOB, nausea, dizziness, severe HA, changes vision/speech, left arm numbness and tingling and jaw pain.  2. Cholesterol -Continue diet and exercise.   3. Prediabetes  -Continue diet and exercise.   4. Vitamin D Def - check level and continue medications.   5. URI Likely more viral, will treat symptoms, Will hold the zpak and take if he is not getting better, increase fluids, rest, cont allergy pill  6. Obesity with co morbidities - long discussion about weight loss, diet, and exercise   Continue diet and meds as discussed. Declines labs at this time, will get in Dec Over 30 minutes of exam, counseling, chart review, and critical decision making was performed  HPI 54 y.o. male  presents for 3 month follow up on hypertension, cholesterol, prediabetes, and vitamin D deficiency.   His blood pressure has been controlled at home, he is off his cozaar but is on atenolol, today their BP is BP: 128/90 mmHg  He does not workout. He denies chest pain, shortness of breath, dizziness.  He is not on cholesterol medication and denies myalgias. His cholesterol is at goal. The cholesterol last visit was:   Lab Results  Component Value Date   CHOL 179 04/05/2014   HDL 63 04/05/2014   LDLCALC 102* 04/05/2014   TRIG 69 04/05/2014   CHOLHDL 2.8 04/05/2014    He has been working on diet and exercise for prediabetes, and denies paresthesia of the feet, polydipsia, polyuria and visual disturbances. Last A1C in the office was:  Lab Results  Component Value Date   HGBA1C 6.4* 04/05/2014   Patient is on Vitamin D supplement.   Lab Results  Component Value Date   VD25OH 25* 04/05/2014     Patient has been having sinus congestion since Friday. Denies fever and chills.  BMI is Body mass index is 33.5  kg/(m^2)., he is working on diet and exercise. Wt Readings from Last 3 Encounters:  01/31/15 268 lb (121.564 kg)  01/17/15 270 lb (122.471 kg)  05/03/14 275 lb (124.739 kg)     Current Medications:  Current Outpatient Prescriptions on File Prior to Visit  Medication Sig Dispense Refill  . atenolol (TENORMIN) 100 MG tablet Take 1 tablet (100 mg total) by mouth daily. (Patient not taking: Reported on 01/17/2015) 30 tablet 0  . cyclobenzaprine (FLEXERIL) 10 MG tablet Take 1 tablet (10 mg total) by mouth every 8 (eight) hours as needed for muscle spasms. 30 tablet 1  . losartan (COZAAR) 100 MG tablet Take 1 tablet (100 mg total) by mouth daily. (Patient not taking: Reported on 01/17/2015) 30 tablet 0  . meloxicam (MOBIC) 15 MG tablet Take 1 tablet (15 mg total) by mouth daily. With food for pain as needed 30 tablet 99  . metFORMIN (GLUCOPHAGE XR) 500 MG 24 hr tablet Start taking 1 tablet PO Qdaily with largest meal.  If tolerating once a day dosing, then increase to 1 tablet PO BID with largest meals. 60 tablet 1  . Multiple Vitamins-Minerals (MULTIVITAMIN WITH MINERALS) tablet Take 1 tablet by mouth daily.    . traMADol (ULTRAM) 50 MG tablet Take 1 tablet (50 mg total) by mouth every 6 (six) hours as needed. 50 tablet 0   No current facility-administered medications on file prior to visit.   Medical History:  Past Medical History  Diagnosis Date  . Hypertension   . Arthritis     neck and knees  . Allergic rhinitis, cause unspecified   . Hyperlipidemia   . Prediabetes    Allergies: No Known Allergies   Review of Systems:  Review of Systems  Constitutional: Positive for malaise/fatigue. Negative for fever, chills and diaphoresis.  HENT: Positive for congestion and sore throat.   Eyes: Negative.   Respiratory: Positive for cough and sputum production. Negative for shortness of breath.   Cardiovascular: Negative.   Gastrointestinal: Negative.   Genitourinary: Negative.     Family  history- Review and unchanged Social history- Review and unchanged Physical Exam: BP 128/90 mmHg  Pulse 64  Temp(Src) 97.7 F (36.5 C)  Resp 18  Ht 6\' 3"  (1.905 m)  Wt 268 lb (121.564 kg)  BMI 33.50 kg/m2 Wt Readings from Last 3 Encounters:  01/31/15 268 lb (121.564 kg)  01/17/15 270 lb (122.471 kg)  05/03/14 275 lb (124.739 kg)   General Appearance: Well nourished, in no apparent distress. Eyes: PERRLA, EOMs, conjunctiva no swelling or erythema Sinuses: + Frontal/maxillary tenderness ENT/Mouth: Ext aud canals clear, TMs without erythema, bulging. No erythema, swelling, or exudate on post pharynx.  Tonsils not swollen or erythematous. Hearing normal.  Neck: Supple, thyroid normal.  Respiratory: Respiratory effort normal, BS equal bilaterally without rales, rhonchi, wheezing or stridor.  Cardio: RRR with no MRGs. Brisk peripheral pulses without edema.  Abdomen: Soft, + BS,  Non tender, no guarding, rebound, hernias, masses. Lymphatics: Non tender without lymphadenopathy.  Musculoskeletal: Full ROM, 5/5 strength, Normal gait Skin: Warm, dry without rashes, lesions, ecchymosis.  Neuro: Cranial nerves intact. Normal muscle tone, no cerebellar symptoms. Psych: Awake and oriented X 3, normal affect, Insight and Judgment appropriate.    Vicie Mutters, PA-C 9:34 AM [facility]

## 2015-05-10 ENCOUNTER — Encounter: Payer: Self-pay | Admitting: Physician Assistant

## 2015-05-27 ENCOUNTER — Encounter: Payer: Self-pay | Admitting: Physician Assistant

## 2015-06-01 ENCOUNTER — Encounter: Payer: Self-pay | Admitting: Internal Medicine

## 2015-06-01 ENCOUNTER — Ambulatory Visit (INDEPENDENT_AMBULATORY_CARE_PROVIDER_SITE_OTHER): Payer: BLUE CROSS/BLUE SHIELD | Admitting: Internal Medicine

## 2015-06-01 VITALS — BP 128/84 | HR 66 | Temp 98.2°F | Resp 18 | Ht 75.0 in | Wt 285.0 lb

## 2015-06-01 DIAGNOSIS — J069 Acute upper respiratory infection, unspecified: Secondary | ICD-10-CM

## 2015-06-01 MED ORDER — AZITHROMYCIN 250 MG PO TABS: ORAL_TABLET | ORAL | Status: DC

## 2015-06-01 MED ORDER — PROMETHAZINE-DM 6.25-15 MG/5ML PO SYRP: ORAL_SOLUTION | ORAL | Status: DC

## 2015-06-01 MED ORDER — PREDNISONE 20 MG PO TABS: ORAL_TABLET | ORAL | Status: DC

## 2015-06-01 MED ORDER — FLUTICASONE PROPIONATE 50 MCG/ACT NA SUSP: 2.0000 | Freq: Every day | NASAL | Status: DC

## 2015-06-01 NOTE — Progress Notes (Signed)
Patient ID: Dale Tapia, male   DOB: 17-Dec-1960, 55 y.o.   MRN: [id number]  HPI  Patient presents to the office for evaluation of cough.  It has been going on for 2 weeks.  Patient reports all the time, dry cough.  They also endorse change in voice, postnasal drip, shortness of breath, wheezing and nasal congestion, nose bleed with nose blowing, sinus headache, ear fullness, mildly sore throat. .  They have tried Tylenol cold medication.  They report that nothing has worked.  They admits to other sick contacts.  Several people at work have the same thing.  Review of Systems  Constitutional: Positive for chills and malaise/fatigue. Negative for fever.  HENT: Positive for congestion, ear pain and sore throat.   Respiratory: Positive for cough, shortness of breath and wheezing. Negative for hemoptysis.   Cardiovascular: Negative for chest pain, palpitations and leg swelling.  Neurological: Positive for headaches.    PE:  Filed Vitals:   06/01/15 1435  BP: 128/84  Pulse: 66  Temp: 98.2 F (36.8 C)  Resp: 18    General:  Alert and non-toxic, WDWN, NAD HEENT: NCAT, PERLA, EOM normal, no occular discharge or erythema.  Nasal mucosal edema with sinus tenderness to palpation.  Oropharynx clear with minimal oropharyngeal edema and erythema.  Mucous membranes moist and pink. Neck:  Cervical adenopathy Chest:  RRR no MRGs.  Lungs clear to auscultation A&P with no wheezes rhonchi or rales.   Abdomen: +BS x 4 quadrants, soft, non-tender, no guarding, rigidity, or rebound. Skin: warm and dry no rash Neuro: A&Ox4, CN II-XII grossly intact  Assessment and Plan:   1. Acute URI -antihistamine -nasal saline - azithromycin (ZITHROMAX Z-PAK) 250 MG tablet; 2 po day one, then 1 daily x 4 days  Dispense: 6 tablet; Refill: 0 - promethazine-dextromethorphan (PROMETHAZINE-DM) 6.25-15 MG/5ML syrup; Take 5-10 mL PO q8hrs as needed for severe coughing and cold symptoms  Dispense: 360 mL; Refill: 1 -  predniSONE (DELTASONE) 20 MG tablet; 3 tabs po daily x 3 days, then 2 tabs x 3 days, then 1.5 tabs x 3 days, then 1 tab x 3 days, then 0.5 tabs x 3 days  Dispense: 27 tablet; Refill: 0 - fluticasone (FLONASE) 50 MCG/ACT nasal spray; Place 2 sprays into both nostrils at bedtime.  Dispense: 16 g; Refill: 1

## 2015-06-13 ENCOUNTER — Ambulatory Visit (HOSPITAL_COMMUNITY)
Admission: RE | Admit: 2015-06-13 | Discharge: 2015-06-13 | Disposition: A | Discharge status: Home / Self Care | Payer: BLUE CROSS/BLUE SHIELD | Source: Ambulatory Visit | Attending: Internal Medicine | Admitting: Internal Medicine

## 2015-06-13 ENCOUNTER — Other Ambulatory Visit: Payer: Self-pay | Admitting: Internal Medicine

## 2015-06-13 ENCOUNTER — Ambulatory Visit (INDEPENDENT_AMBULATORY_CARE_PROVIDER_SITE_OTHER): Payer: BLUE CROSS/BLUE SHIELD | Admitting: Internal Medicine

## 2015-06-13 ENCOUNTER — Encounter: Payer: Self-pay | Admitting: Internal Medicine

## 2015-06-13 VITALS — BP 146/90 | HR 100 | Temp 99.8°F | Resp 18 | Ht 75.0 in | Wt 280.0 lb

## 2015-06-13 DIAGNOSIS — R05 Cough: Secondary | ICD-10-CM | POA: Diagnosis present

## 2015-06-13 DIAGNOSIS — R0600 Dyspnea, unspecified: Secondary | ICD-10-CM | POA: Diagnosis not present

## 2015-06-13 DIAGNOSIS — J069 Acute upper respiratory infection, unspecified: Secondary | ICD-10-CM

## 2015-06-13 DIAGNOSIS — R0989 Other specified symptoms and signs involving the circulatory and respiratory systems: Secondary | ICD-10-CM | POA: Insufficient documentation

## 2015-06-13 MED ORDER — DOXYCYCLINE HYCLATE 100 MG PO CAPS: 100.0000 mg | ORAL_CAPSULE | Freq: Two times a day (BID) | ORAL | Status: DC

## 2015-06-13 MED ORDER — AZELASTINE HCL 0.1 % NA SOLN: 2.0000 | Freq: Two times a day (BID) | NASAL | Status: DC

## 2015-06-13 MED ORDER — ALBUTEROL SULFATE HFA 108 (90 BASE) MCG/ACT IN AERS: 2.0000 | INHALATION_SPRAY | RESPIRATORY_TRACT | Status: DC | PRN

## 2015-06-13 MED ORDER — ATENOLOL 100 MG PO TABS: 100.0000 mg | ORAL_TABLET | Freq: Every day | ORAL | Status: DC

## 2015-06-13 NOTE — Progress Notes (Signed)
Patient ID: Dale Tapia, male   DOB: 1961-05-18, 55 y.o.   MRN: [id number]  HPI  Patient presents to the office for evaluation of cough.  It has been going on for 2 weeks.  Patient reports all the time, dry, barky.  They also endorse change in voice, postnasal drip, shortness of breath, sputum production, wheezing and nasal congestion, headache, clear nasal sputum.  .  They have tried antitussives or antibiotics.  They report that nothing has worked.  They admits to other sick contacts.  Review of Systems  Constitutional: Positive for chills and malaise/fatigue. Negative for fever.  HENT: Positive for congestion and sore throat. Negative for ear pain.   Eyes: Negative.   Respiratory: Positive for cough and shortness of breath. Negative for sputum production and wheezing.   Cardiovascular: Negative for chest pain, palpitations and leg swelling.  Neurological: Positive for headaches.    PE:  Filed Vitals:   06/13/15 0945  BP: 146/90  Pulse: 100  Temp: 99.8 F (37.7 C)  Resp: 18    General:  Alert and non-toxic, WDWN, NAD HEENT: NCAT, PERLA, EOM normal, no occular discharge or erythema.  Nasal mucosal edema with sinus tenderness to palpation.  Oropharynx clear with minimal oropharyngeal edema and erythema.  Mucous membranes moist and pink. Neck:  Cervical adenopathy Chest:  RRR no MRGs.  Lungs clear to auscultation A&P with no wheezes rhonchi or rales.  Mildly increased respiratory rate and effort   Abdomen: +BS x 4 quadrants, soft, non-tender, no guarding, rigidity, or rebound. Skin: warm and dry no rash Neuro: A&Ox4, CN II-XII grossly intact  Assessment and Plan:   1. Acute URI of multiple sites -breo -astelin -flonase -cont zyrtec - DG Chest 2 View; Future - albuterol (VENTOLIN HFA) 108 (90 Base) MCG/ACT inhaler; Inhale 2 puffs into the lungs every 4 (four) hours as needed for wheezing or shortness of breath.  Dispense: 1 Inhaler; Refill: 2 -may send in doxy or levaquin  based on CXR

## 2015-06-16 ENCOUNTER — Other Ambulatory Visit: Payer: Self-pay | Admitting: Physician Assistant

## 2015-06-16 MED ORDER — DOXYCYCLINE HYCLATE 100 MG PO CAPS: 100.0000 mg | ORAL_CAPSULE | Freq: Two times a day (BID) | ORAL | Status: DC

## 2015-06-30 ENCOUNTER — Encounter: Payer: Self-pay | Admitting: Physician Assistant

## 2015-07-16 ENCOUNTER — Other Ambulatory Visit: Payer: Self-pay | Admitting: Internal Medicine

## 2015-07-16 DIAGNOSIS — J069 Acute upper respiratory infection, unspecified: Secondary | ICD-10-CM

## 2015-07-16 MED ORDER — PROMETHAZINE-DM 6.25-15 MG/5ML PO SYRP: ORAL_SOLUTION | ORAL | Status: AC

## 2015-07-16 MED ORDER — PREDNISONE 20 MG PO TABS: ORAL_TABLET | ORAL | Status: DC

## 2015-09-30 ENCOUNTER — Encounter: Payer: Self-pay | Admitting: Physician Assistant

## 2015-10-25 ENCOUNTER — Other Ambulatory Visit: Payer: Self-pay | Admitting: *Deleted

## 2015-10-25 MED ORDER — ATENOLOL 100 MG PO TABS: 100.0000 mg | ORAL_TABLET | Freq: Every day | ORAL | Status: DC

## 2015-10-28 ENCOUNTER — Encounter: Payer: Self-pay | Admitting: Internal Medicine

## 2015-10-28 ENCOUNTER — Other Ambulatory Visit: Payer: Self-pay | Admitting: Internal Medicine

## 2015-11-18 ENCOUNTER — Ambulatory Visit (INDEPENDENT_AMBULATORY_CARE_PROVIDER_SITE_OTHER): Payer: BLUE CROSS/BLUE SHIELD | Admitting: Internal Medicine

## 2015-11-18 ENCOUNTER — Encounter: Payer: Self-pay | Admitting: Internal Medicine

## 2015-11-18 VITALS — BP 144/88 | HR 56 | Temp 98.0°F | Resp 18 | Ht 75.0 in | Wt 268.0 lb

## 2015-11-18 DIAGNOSIS — Z79899 Other long term (current) drug therapy: Secondary | ICD-10-CM | POA: Diagnosis not present

## 2015-11-18 DIAGNOSIS — Z136 Encounter for screening for cardiovascular disorders: Secondary | ICD-10-CM | POA: Diagnosis not present

## 2015-11-18 DIAGNOSIS — E669 Obesity, unspecified: Secondary | ICD-10-CM

## 2015-11-18 DIAGNOSIS — E559 Vitamin D deficiency, unspecified: Secondary | ICD-10-CM | POA: Diagnosis not present

## 2015-11-18 DIAGNOSIS — I10 Essential (primary) hypertension: Secondary | ICD-10-CM

## 2015-11-18 DIAGNOSIS — E785 Hyperlipidemia, unspecified: Secondary | ICD-10-CM

## 2015-11-18 DIAGNOSIS — Z125 Encounter for screening for malignant neoplasm of prostate: Secondary | ICD-10-CM | POA: Diagnosis not present

## 2015-11-18 DIAGNOSIS — J309 Allergic rhinitis, unspecified: Secondary | ICD-10-CM

## 2015-11-18 DIAGNOSIS — E349 Endocrine disorder, unspecified: Secondary | ICD-10-CM

## 2015-11-18 DIAGNOSIS — Z0000 Encounter for general adult medical examination without abnormal findings: Secondary | ICD-10-CM

## 2015-11-18 DIAGNOSIS — Z130 Encounter for screening for diseases of the blood and blood-forming organs and certain disorders involving the immune mechanism: Secondary | ICD-10-CM

## 2015-11-18 DIAGNOSIS — R7303 Prediabetes: Secondary | ICD-10-CM

## 2015-11-18 LAB — BASIC METABOLIC PANEL WITH GFR
BUN: 17 mg/dL (ref 7–25)
CALCIUM: 9.2 mg/dL (ref 8.6–10.3)
CO2: 26 mmol/L (ref 20–31)
CREATININE: 0.96 mg/dL (ref 0.70–1.33)
Chloride: 108 mmol/L (ref 98–110)
GFR, Est Non African American: >89 mL/min (ref >=60)
GLUCOSE: 114 mg/dL — AB (ref 65–99)
Potassium: 4.2 mmol/L (ref 3.5–5.3)
Sodium: 141 mmol/L (ref 135–146)

## 2015-11-18 LAB — CBC WITH DIFFERENTIAL/PLATELET
BASOS ABS: 0 {cells}/uL (ref 0–200)
Basophils Relative: 0 %
EOS ABS: 177 {cells}/uL (ref 15–500)
Eosinophils Relative: 3 %
HEMATOCRIT: 42.1 % (ref 38.5–50.0)
HEMOGLOBIN: 14.1 g/dL (ref 13.2–17.1)
LYMPHS ABS: 1003 {cells}/uL (ref 850–3900)
LYMPHS PCT: 17 %
MCH: 31.1 pg (ref 27.0–33.0)
MCHC: 33.5 g/dL (ref 32.0–36.0)
MCV: 92.9 fL (ref 80.0–100.0)
MONO ABS: 590 {cells}/uL (ref 200–950)
MPV: 10.8 fL (ref 7.5–12.5)
Monocytes Relative: 10 %
NEUTROS ABS: 4130 {cells}/uL (ref 1500–7800)
NEUTROS PCT: 70 %
Platelets: 182 10*3/uL (ref 140–400)
RBC: 4.53 MIL/uL (ref 4.20–5.80)
RDW: 13.2 % (ref 11.0–15.0)
WBC: 5.9 10*3/uL (ref 3.8–10.8)

## 2015-11-18 LAB — HEPATIC FUNCTION PANEL
ALBUMIN: 4.2 g/dL (ref 3.6–5.1)
ALT: 20 U/L (ref 9–46)
AST: 23 U/L (ref 10–35)
Alkaline Phosphatase: 79 U/L (ref 40–115)
Bilirubin, Direct: 0.3 mg/dL — ABNORMAL HIGH (ref <=0.2)
Indirect Bilirubin: 0.9 mg/dL (ref 0.2–1.2)
TOTAL PROTEIN: 6.3 g/dL (ref 6.1–8.1)
Total Bilirubin: 1.2 mg/dL (ref 0.2–1.2)

## 2015-11-18 LAB — IRON AND TIBC
%SAT: 33 % (ref 15–60)
IRON: 102 ug/dL (ref 50–180)
TIBC: 307 ug/dL (ref 250–425)
UIBC: 205 ug/dL (ref 125–400)

## 2015-11-18 LAB — HEMOGLOBIN A1C
Hgb A1c MFr Bld: 5.8 % — ABNORMAL HIGH (ref <5.7)
MEAN PLASMA GLUCOSE: 120 mg/dL

## 2015-11-18 LAB — MAGNESIUM: MAGNESIUM: 2 mg/dL (ref 1.5–2.5)

## 2015-11-18 LAB — LIPID PANEL
CHOLESTEROL: 183 mg/dL (ref 125–200)
HDL: 57 mg/dL (ref >=40)
LDL Cholesterol: 116 mg/dL (ref <130)
Total CHOL/HDL Ratio: 3.2 Ratio (ref <=5.0)
Triglycerides: 49 mg/dL (ref <150)
VLDL: 10 mg/dL (ref <30)

## 2015-11-18 LAB — TSH: TSH: 2.01 mIU/L (ref 0.40–4.50)

## 2015-11-18 LAB — VITAMIN B12: Vitamin B-12: 310 pg/mL (ref 200–1100)

## 2015-11-18 MED ORDER — ALBUTEROL SULFATE HFA 108 (90 BASE) MCG/ACT IN AERS: 2.0000 | INHALATION_SPRAY | RESPIRATORY_TRACT | Status: DC | PRN

## 2015-11-18 MED ORDER — AZELASTINE HCL 0.1 % NA SOLN: 2.0000 | Freq: Two times a day (BID) | NASAL | Status: DC

## 2015-11-18 MED ORDER — FLUTICASONE PROPIONATE 50 MCG/ACT NA SUSP: 2.0000 | Freq: Every day | NASAL | Status: DC

## 2015-11-18 MED ORDER — ATENOLOL 100 MG PO TABS: 100.0000 mg | ORAL_TABLET | Freq: Every day | ORAL | Status: DC

## 2015-11-18 NOTE — Progress Notes (Signed)
Complete Physical  Assessment and Plan:   1. Routine general medical examination at a health care facility  - CBC with Differential/Platelet - BASIC METABOLIC PANEL WITH GFR - Hepatic function panel - Magnesium  2. Allergic rhinitis, unspecified allergic rhinitis type  - albuterol (VENTOLIN HFA) 108 (90 Base) MCG/ACT inhaler; Inhale 2 puffs into the lungs every 4 (four) hours as needed for wheezing or shortness of breath.  Dispense: 1 Inhaler; Refill: 2 - fluticasone (FLONASE) 50 MCG/ACT nasal spray; Place 2 sprays into both nostrils at bedtime.  Dispense: 16 g; Refill: 1 - azelastine (ASTELIN) 0.1 % nasal spray; Place 2 sprays into both nostrils 2 (two) times daily. Use in each nostril as directed  Dispense: 30 mL; Refill: 2  3. Essential hypertension  - atenolol (TENORMIN) 100 MG tablet; Take 1 tablet (100 mg total) by mouth daily.  Dispense: 90 tablet; Refill: 0 - Urinalysis, Routine w reflex microscopic (not at [facility]) - Microalbumin / creatinine urine ratio - EKG 12-Lead - [place], RETROPERITNL ABD,  LTD - TSH  4. Hyperlipidemia  - Lipid panel  5. Prediabetes  - Hemoglobin A1c - Insulin, random  6. Obesity -cont diet and exercise as tolerated  7. Screening for prostate cancer  - PSA  8. Screening for deficiency anemia  - Iron and TIBC - Vitamin B12  9. Testosterone deficiency  - Testosterone  10. Vitamin D deficiency  - VITAMIN D 25 Hydroxy (Vit-D Deficiency, Fractures)       Discussed med's effects and SE's. Screening labs and tests as requested with regular follow-up as recommended.  HPI Patient presents for a complete physical.   His blood pressure has been controlled at home, today their BP is BP: (!) 144/88 mmHg He does not workout. He denies chest pain, shortness of breath, dizziness.  He did run out of his Losartan.  He has been off it "a while".  He reports that he asked the pharmacy to get in touch with [place] and nothing helped.     He is on  cholesterol medication and denies myalgias. His cholesterol is at goal. The cholesterol last visit was:   Lab Results  Component Value Date   CHOL 179 04/05/2014   HDL 63 04/05/2014   LDLCALC 102* 04/05/2014   TRIG 69 04/05/2014   CHOLHDL 2.8 04/05/2014    He has been working on diet and exercise for prediabetes, he is not on bASA, he is on ACE/ARB and denies foot ulcerations, hyperglycemia, hypoglycemia , increased appetite, nausea, paresthesia of the feet, polydipsia, polyuria, visual disturbances, vomiting and weight loss. Last A1C in the office was:  Lab Results  Component Value Date   HGBA1C 6.4* 04/05/2014    Patient is on Vitamin D supplement.   Lab Results  Component Value Date   VD25OH 25* 04/05/2014      Last PSA was: Lab Results  Component Value Date   PSA 0.80 05/03/2014  .  Denies BPH symptoms daytime frequency, double voiding, dysuria, hematuria, hesitancy, incontinence, intermittency, nocturia, sensation of incomplete bladder emptying, suprapubic pain, urgency or weak urinary stream.  He reports that he is seeing [place] ortho for his knees.  He reports that he does have severe OA and will likely need a knee replacements.    Allergies have been well controlled on current medications.  He is running low on albuterol.  Only uses once every other week.     Current Medications:  Current Outpatient Prescriptions on File Prior to Visit  Medication Sig  Dispense Refill  . albuterol (VENTOLIN HFA) 108 (90 Base) MCG/ACT inhaler Inhale 2 puffs into the lungs every 4 (four) hours as needed for wheezing or shortness of breath. 1 Inhaler 2  . atenolol (TENORMIN) 100 MG tablet Take 1 tablet (100 mg total) by mouth daily. 90 tablet 0  . azelastine (ASTELIN) 0.1 % nasal spray Place 2 sprays into both nostrils 2 (two) times daily. Use in each nostril as directed 30 mL 2  . fluticasone (FLONASE) 50 MCG/ACT nasal spray Place 2 sprays into both nostrils at bedtime. 16 g 1  .  Multiple Vitamins-Minerals (MULTIVITAMIN WITH MINERALS) tablet Take 1 tablet by mouth daily.    . traMADol (ULTRAM) 50 MG tablet Take 1 tablet (50 mg total) by mouth every 6 (six) hours as needed. 50 tablet 0   No current facility-administered medications on file prior to visit.    Health Maintenance:  Immunization History  Administered Date(s) Administered  . Td 06/12/2005  . Tdap 01/18/2012    Tetanus: 2013 Flu vaccine:Declined Zostavax: Not indicated Colonoscopy: 2014 Eye Exam: 2017, Dr. [place] Dentist: Twice yearly visits  Patient Care Team: Unk Pinto, MD as PCP - General (Internal Medicine) Irene Shipper, MD as Consulting Physician (Gastroenterology)  Allergies: No Known Allergies  Medical History:  Past Medical History  Diagnosis Date  . Hypertension   . Arthritis     neck and knees  . Allergic rhinitis, cause unspecified   . Hyperlipidemia   . Prediabetes     Surgical History: No past surgical history on file.  Family History:  Family History  Problem Relation Age of Onset  . Colon cancer Neg Hx   . Hypertension Mother   . Arthritis Mother   . Sleep apnea Mother   . Hypertension Father   . Kidney disease Father     Social History:   Social History  Substance Use Topics  . Smoking status: Never Smoker   . Smokeless tobacco: Never Used  . Alcohol Use: No    Review of Systems:  Review of Systems  Constitutional: Negative for fever, chills and malaise/fatigue.  HENT: Negative for congestion, ear pain and sore throat.   Respiratory: Negative for cough, shortness of breath and wheezing.   Cardiovascular: Negative for chest pain, palpitations and leg swelling.  Gastrointestinal: Positive for heartburn. Negative for abdominal pain, diarrhea, constipation, blood in stool and melena.  Genitourinary: Negative.   Neurological: Negative for dizziness, sensory change, loss of consciousness and headaches.  Psychiatric/Behavioral: Negative for depression  and memory loss. The patient is not nervous/anxious and does not have insomnia.     Physical Exam: Estimated body mass index is 33.5 kg/(m^2) as calculated from the following:   Height as of this encounter: 6\' 3"  (1.905 m).   Weight as of this encounter: 268 lb (121.564 kg). BP 144/88 mmHg  Pulse 56  Temp(Src) 98 F (36.7 C) (Temporal)  Resp 18  Ht 6\' 3"  (1.905 m)  Wt 268 lb (121.564 kg)  BMI 33.50 kg/m2  General Appearance: Well nourished, in no apparent distress.  Eyes: PERRLA, EOMs, conjunctiva no swelling or erythema ENT/Mouth: Ear canals clear bilaterally with no erythema, swelling, discharge.  TMs normal bilaterally with no erythema, bulging, or retractions.  Oropharynx clear and moist with no exudate, swelling, or erythema.  Dentition normal.   Neck: Supple, thyroid normal. No bruits, JVD, cervical adenopathy Respiratory: Respiratory effort normal, BS equal bilaterally without rales, rhonchi, wheezing or stridor.  Cardio: RRR without murmurs, rubs or  gallops. Brisk peripheral pulses without edema.  Chest: symmetric, with normal excursions Abdomen: Soft, nontender, no guarding, rebound, hernias, masses, or organomegaly. Genitourinary:  Musculoskeletal: Full ROM all peripheral extremities,5/5 strength, and normal gait.  Skin: Warm, dry without rashes, lesions, ecchymosis. Neuro: A&Ox3, Cranial nerves intact, reflexes equal bilaterally. Normal muscle tone, no cerebellar symptoms. Sensation intact.  Psych: Normal affect, Insight and Judgment appropriate.   EKG: WNL no changes.  AORTA SCAN: WNL  Over 40 minutes of exam, counseling, chart review and critical decision making was performed  Starlyn Skeans 9:42 AM [facility]

## 2015-11-19 LAB — MICROALBUMIN / CREATININE URINE RATIO
Creatinine, Urine: 338 mg/dL (ref 20–370)
MICROALB/CREAT RATIO: 2 ug/mg{creat} (ref <30)
Microalb, Ur: 0.6 mg/dL

## 2015-11-19 LAB — URINALYSIS, ROUTINE W REFLEX MICROSCOPIC
GLUCOSE, UA: NEGATIVE
Hgb urine dipstick: NEGATIVE
Ketones, ur: NEGATIVE
LEUKOCYTES UA: NEGATIVE
Nitrite: NEGATIVE
PROTEIN: NEGATIVE
SPECIFIC GRAVITY, URINE: 1.025 (ref 1.001–1.035)
pH: 5.5 (ref 5.0–8.0)

## 2015-11-19 LAB — PSA: PSA: 0.47 ng/mL (ref <=4.00)

## 2015-11-19 LAB — TESTOSTERONE: TESTOSTERONE: 530 ng/dL (ref 250–827)

## 2015-11-19 LAB — INSULIN, RANDOM: INSULIN: 6.3 u[IU]/mL (ref 2.0–19.6)

## 2015-11-21 LAB — VITAMIN D 25 HYDROXY (VIT D DEFICIENCY, FRACTURES): VIT D 25 HYDROXY: 17 ng/mL — AB (ref 30–100)

## 2016-02-08 ENCOUNTER — Other Ambulatory Visit: Payer: Self-pay | Admitting: *Deleted

## 2016-02-08 DIAGNOSIS — I10 Essential (primary) hypertension: Secondary | ICD-10-CM

## 2016-02-08 MED ORDER — ATENOLOL 100 MG PO TABS: 100.0000 mg | ORAL_TABLET | Freq: Every day | ORAL | 0 refills | Status: DC

## 2016-02-24 ENCOUNTER — Ambulatory Visit (INDEPENDENT_AMBULATORY_CARE_PROVIDER_SITE_OTHER): Payer: BLUE CROSS/BLUE SHIELD | Admitting: Internal Medicine

## 2016-02-24 ENCOUNTER — Encounter: Payer: Self-pay | Admitting: Internal Medicine

## 2016-02-24 VITALS — BP 144/92 | HR 82 | Temp 98.6°F | Resp 18 | Ht 75.0 in | Wt 282.0 lb

## 2016-02-24 DIAGNOSIS — I10 Essential (primary) hypertension: Secondary | ICD-10-CM

## 2016-02-24 DIAGNOSIS — E782 Mixed hyperlipidemia: Secondary | ICD-10-CM

## 2016-02-24 DIAGNOSIS — Z79899 Other long term (current) drug therapy: Secondary | ICD-10-CM

## 2016-02-24 DIAGNOSIS — Z1159 Encounter for screening for other viral diseases: Secondary | ICD-10-CM

## 2016-02-24 DIAGNOSIS — E6609 Other obesity due to excess calories: Secondary | ICD-10-CM | POA: Diagnosis not present

## 2016-02-24 DIAGNOSIS — J302 Other seasonal allergic rhinitis: Secondary | ICD-10-CM

## 2016-02-24 DIAGNOSIS — R7303 Prediabetes: Secondary | ICD-10-CM

## 2016-02-24 DIAGNOSIS — Z6835 Body mass index (BMI) 35.0-35.9, adult: Secondary | ICD-10-CM

## 2016-02-24 LAB — HEPATIC FUNCTION PANEL
ALK PHOS: 86 U/L (ref 40–115)
ALT: 21 U/L (ref 9–46)
AST: 25 U/L (ref 10–35)
Albumin: 3.9 g/dL (ref 3.6–5.1)
BILIRUBIN DIRECT: 0.3 mg/dL — AB (ref <=0.2)
BILIRUBIN INDIRECT: 0.8 mg/dL (ref 0.2–1.2)
Total Bilirubin: 1.1 mg/dL (ref 0.2–1.2)
Total Protein: 6.3 g/dL (ref 6.1–8.1)

## 2016-02-24 LAB — CBC WITH DIFFERENTIAL/PLATELET
BASOS PCT: 0 %
Basophils Absolute: 0 cells/uL (ref 0–200)
EOS PCT: 2 %
Eosinophils Absolute: 170 cells/uL (ref 15–500)
HEMATOCRIT: 42.9 % (ref 38.5–50.0)
HEMOGLOBIN: 14.8 g/dL (ref 13.2–17.1)
LYMPHS ABS: 935 {cells}/uL (ref 850–3900)
Lymphocytes Relative: 11 %
MCH: 31.1 pg (ref 27.0–33.0)
MCHC: 34.5 g/dL (ref 32.0–36.0)
MCV: 90.1 fL (ref 80.0–100.0)
MONO ABS: 1020 {cells}/uL — AB (ref 200–950)
MPV: 10.1 fL (ref 7.5–12.5)
Monocytes Relative: 12 %
NEUTROS ABS: 6375 {cells}/uL (ref 1500–7800)
Neutrophils Relative %: 75 %
Platelets: 165 10*3/uL (ref 140–400)
RBC: 4.76 MIL/uL (ref 4.20–5.80)
RDW: 13.1 % (ref 11.0–15.0)
WBC: 8.5 10*3/uL (ref 3.8–10.8)

## 2016-02-24 LAB — BASIC METABOLIC PANEL WITH GFR
BUN: 14 mg/dL (ref 7–25)
CALCIUM: 8.8 mg/dL (ref 8.6–10.3)
CHLORIDE: 104 mmol/L (ref 98–110)
CO2: 25 mmol/L (ref 20–31)
Creat: 1.11 mg/dL (ref 0.70–1.33)
GFR, EST NON AFRICAN AMERICAN: 75 mL/min (ref >=60)
GFR, Est African American: 87 mL/min (ref >=60)
GLUCOSE: 106 mg/dL — AB (ref 65–99)
POTASSIUM: 4.2 mmol/L (ref 3.5–5.3)
SODIUM: 137 mmol/L (ref 135–146)

## 2016-02-24 LAB — LIPID PANEL
Cholesterol: 176 mg/dL (ref 125–200)
HDL: 48 mg/dL (ref >=40)
LDL CALC: 113 mg/dL (ref <130)
TRIGLYCERIDES: 75 mg/dL (ref <150)
Total CHOL/HDL Ratio: 3.7 Ratio (ref <=5.0)
VLDL: 15 mg/dL (ref <30)

## 2016-02-24 LAB — TSH: TSH: 1.99 m[IU]/L (ref 0.40–4.50)

## 2016-02-24 LAB — HEMOGLOBIN A1C
HEMOGLOBIN A1C: 6.3 % — AB (ref <5.7)
Mean Plasma Glucose: 134 mg/dL

## 2016-02-24 LAB — HEPATITIS C ANTIBODY: HCV Ab: NEGATIVE

## 2016-02-24 MED ORDER — METOPROLOL SUCCINATE ER 50 MG PO TB24: 50.0000 mg | ORAL_TABLET | Freq: Every day | ORAL | 11 refills | Status: DC

## 2016-02-24 NOTE — Progress Notes (Signed)
Assessment and Plan:  Hypertension:  -stop atenolol due to back order -start metoprolol ER 50 mg -if continued elevated at home call office -Continue medication -monitor blood pressure at home. -Continue DASH diet -Reminder to go to the ER if any CP, SOB, nausea, dizziness, severe HA, changes vision/speech, left arm numbness and tingling and jaw pain.  Cholesterol - Continue diet and exercise -Check cholesterol.   Diabetes without complications -Continue diet and exercise.  -Check A1C  Vitamin D Def -check level -continue medications.   Morbid obesity -cont diet and exercise -has gained 12 lbs since last visit -patient is aware that he needs to cut back on food and increase exercise  Allergic rhinitis -cont astelin -cont flonase -albuterol prn   Need for Hep C screening -Hep C antibody  Continue diet and meds as discussed. Further disposition pending results of labs. Discussed med's effects and SE's.    HPI 55 y.o. male  presents for 3 month follow up with hypertension, hyperlipidemia, diabetes and vitamin D deficiency.   His blood pressure has been controlled at home, today their BP is BP: (!) 144/92.He does not workout. He denies chest pain, shortness of breath, dizziness.  He reports that he has been out of his atenolol for the last 2 weeks.  He reports that he has not been taking it.  He reports that he didn't know that the pharmacy wouldn't call him.     He is on cholesterol medication and denies myalgias. His cholesterol is at goal. The cholesterol was:  11/18/2015: Cholesterol 183; HDL 57; LDL Cholesterol 116; Triglycerides 49   He has been working on diet and exercise for diabetes without complications, he is on bASA, he is on ACE/ARB, and denies  foot ulcerations, hyperglycemia, hypoglycemia , increased appetite, nausea, paresthesia of the feet, polydipsia, polyuria, visual disturbances, vomiting and weight loss. Last A1C was: 11/18/2015: Hgb A1c MFr Bld 5.8    Patient is on Vitamin D supplement. 11/18/2015: Vit D, 25-Hydroxy 17  He is still taking antiinflammatory medications.  He does occasionally get reflux with it.  He does try to take breaks from it.    His allergies have been okay.  He has been taking both his nasal sprays and has been washing his hands really frequently.     Current Medications:  Current Outpatient Prescriptions on File Prior to Visit  Medication Sig Dispense Refill  . albuterol (VENTOLIN HFA) 108 (90 Base) MCG/ACT inhaler Inhale 2 puffs into the lungs every 4 (four) hours as needed for wheezing or shortness of breath. 1 Inhaler 2  . azelastine (ASTELIN) 0.1 % nasal spray Place 2 sprays into both nostrils 2 (two) times daily. Use in each nostril as directed 30 mL 2  . etodolac (LODINE) 500 MG tablet Take 500 mg by mouth 2 (two) times daily.    . fluticasone (FLONASE) 50 MCG/ACT nasal spray Place 2 sprays into both nostrils at bedtime. 16 g 1  . Multiple Vitamins-Minerals (MULTIVITAMIN WITH MINERALS) tablet Take 1 tablet by mouth daily.    Marland Kitchen atenolol (TENORMIN) 100 MG tablet Take 1 tablet (100 mg total) by mouth daily. (Patient not taking: Reported on 02/24/2016) 90 tablet 0   No current facility-administered medications on file prior to visit.    Medical History:  Past Medical History:  Diagnosis Date  . Allergic rhinitis, cause unspecified   . Arthritis    neck and knees  . Hyperlipidemia   . Hypertension   . Prediabetes    Allergies:  No Known Allergies   Review of Systems:  Review of Systems  Constitutional: Negative for chills, fever and malaise/fatigue.  HENT: Negative for congestion, ear pain and sore throat.   Eyes: Negative.   Respiratory: Negative for cough, shortness of breath and wheezing.   Cardiovascular: Negative for chest pain, palpitations and leg swelling.  Gastrointestinal: Negative for abdominal pain, blood in stool, constipation, diarrhea, heartburn and melena.  Genitourinary: Negative.    Skin: Negative.   Neurological: Negative for dizziness, sensory change, loss of consciousness and headaches.  Psychiatric/Behavioral: Negative for depression. The patient is not nervous/anxious and does not have insomnia.     Family history- Review and unchanged  Social history- Review and unchanged  Physical Exam: BP (!) 144/92   Pulse 82   Temp 98.6 F (37 C) (Temporal)   Resp 18   Ht 6\' 3"  (1.905 m)   Wt 282 lb (127.9 kg)   BMI 35.25 kg/m  Wt Readings from Last 3 Encounters:  02/24/16 282 lb (127.9 kg)  11/18/15 268 lb (121.6 kg)  06/13/15 280 lb (127 kg)   General Appearance: Well nourished well developed, non-toxic appearing, in no apparent distress. Eyes: PERRLA, EOMs, conjunctiva no swelling or erythema ENT/Mouth: Ear canals clear with no erythema, swelling, or discharge.  TMs normal bilaterally, oropharynx clear, moist, with no exudate.   Neck: Supple, thyroid normal, no JVD, no cervical adenopathy.  Respiratory: Respiratory effort normal, breath sounds clear A&P, no wheeze, rhonchi or rales noted.  No retractions, no accessory muscle usage Cardio: RRR with no MRGs. No noted edema.  Abdomen: Soft, + BS.  Non tender, no guarding, rebound, hernias, masses. Musculoskeletal: Full ROM, 5/5 strength, Normal gait Skin: Warm, dry without rashes, lesions, ecchymosis.  Neuro: Awake and oriented X 3, Cranial nerves intact. No cerebellar symptoms.  Psych: normal affect, Insight and Judgment appropriate.    Starlyn Skeans, PA-C 9:16 AM [facility]

## 2016-02-27 ENCOUNTER — Ambulatory Visit (INDEPENDENT_AMBULATORY_CARE_PROVIDER_SITE_OTHER): Payer: BLUE CROSS/BLUE SHIELD | Admitting: Internal Medicine

## 2016-02-27 ENCOUNTER — Encounter: Payer: Self-pay | Admitting: Internal Medicine

## 2016-02-27 VITALS — BP 186/102 | HR 64 | Temp 97.7°F | Resp 16 | Ht 75.0 in | Wt 284.6 lb

## 2016-02-27 DIAGNOSIS — M25562 Pain in left knee: Secondary | ICD-10-CM

## 2016-02-27 MED ORDER — METOPROLOL SUCCINATE ER 50 MG PO TB24: 100.0000 mg | ORAL_TABLET | Freq: Every day | ORAL | 11 refills | Status: DC

## 2016-02-27 MED ORDER — TRAMADOL HCL 50 MG PO TABS: 100.0000 mg | ORAL_TABLET | Freq: Four times a day (QID) | ORAL | 0 refills | Status: DC | PRN

## 2016-02-27 MED ORDER — PREDNISONE 20 MG PO TABS: ORAL_TABLET | ORAL | 0 refills | Status: DC

## 2016-02-27 NOTE — Progress Notes (Signed)
   Subjective:    Patient ID: Dale Tapia, male    DOB: [birth year], 55 y.o.   MRN: [id number]  HPI  Patient presents to the office for evaluation of left knee pain which worsened this morning.  He reports that the left knee he was barely able to bend it this morning.  Pain is posterior.  He reports that his pain was 5/10, and it was sharp stabbing pain.  He reports that it was swollen.  He has been seen by ortho and was told that he did have osteoarthritis and would likely need a knee replacement.  He has not had an injection in his knee in approximately 2 months.  He reports no injury and has not done anything out of the ordinary.  He has only taken his vitamin D.  He reports that he is taking the metoprolol 50 mg ER last night.     Review of Systems  Constitutional: Negative for chills and fever.  HENT: Negative for congestion, ear pain and sore throat.   Eyes: Negative.   Respiratory: Negative for cough, shortness of breath and wheezing.   Cardiovascular: Negative for chest pain, palpitations and leg swelling.  Gastrointestinal: Negative for abdominal pain, blood in stool, constipation and diarrhea.  Genitourinary: Negative.   Musculoskeletal: Positive for arthralgias. Negative for gait problem, joint swelling and myalgias.  Skin: Negative.   Neurological: Negative for dizziness and headaches.  Psychiatric/Behavioral: The patient is not nervous/anxious.        Objective:   Physical Exam  Constitutional: He is oriented to person, place, and time. He appears well-developed and well-nourished. No distress.  HENT:  Head: Normocephalic.  Mouth/Throat: Oropharynx is clear and moist. No oropharyngeal exudate.  Eyes: Conjunctivae are normal. No scleral icterus.  Neck: Normal range of motion. Neck supple. No JVD present. No thyromegaly present.  Cardiovascular: Normal rate, regular rhythm, normal heart sounds and intact distal pulses.  Exam reveals no gallop and no friction rub.   No  murmur heard. Pulmonary/Chest: Effort normal and breath sounds normal. No respiratory distress. He has no wheezes. He has no rales. He exhibits no tenderness.  Abdominal: Soft. Bowel sounds are normal. He exhibits no distension and no mass. There is no tenderness. There is no rebound and no guarding.  Musculoskeletal:       Left knee: He exhibits decreased range of motion, swelling and effusion. He exhibits no ecchymosis, no deformity, no laceration, no erythema, normal alignment, no LCL laxity, normal patellar mobility, no bony tenderness, normal meniscus and no MCL laxity. Tenderness found. Medial joint line and lateral joint line tenderness noted. No MCL, no LCL and no patellar tendon tenderness noted.  Crepitus of the knee with passive flexion and extension  Lymphadenopathy:    He has no cervical adenopathy.  Neurological: He is alert and oriented to person, place, and time. No cranial nerve deficit. Coordination normal.  Skin: Skin is warm and dry. No rash noted. He is not diaphoretic.  Psychiatric: He has a normal mood and affect. His behavior is normal. Judgment and thought content normal.  Nursing note and vitals reviewed.   Vitals:   02/27/16 1456  BP: (!) 186/102  Pulse: 64  Resp: 16  Temp: 97.7 F (36.5 C)          Assessment & Plan:    1. Acute pain of left knee -prednisone -try sample of pennsaid -likely OA with possible bakers cyst -needs to go back to ortho

## 2016-03-07 ENCOUNTER — Ambulatory Visit (INDEPENDENT_AMBULATORY_CARE_PROVIDER_SITE_OTHER): Payer: Self-pay | Admitting: Orthopedic Surgery

## 2016-04-06 ENCOUNTER — Other Ambulatory Visit: Payer: Self-pay | Admitting: Internal Medicine

## 2016-04-06 MED ORDER — CEPHALEXIN 500 MG PO CAPS: 500.0000 mg | ORAL_CAPSULE | Freq: Four times a day (QID) | ORAL | 0 refills | Status: AC

## 2016-04-06 MED ORDER — PREDNISONE 20 MG PO TABS: ORAL_TABLET | ORAL | 0 refills | Status: DC

## 2016-05-10 ENCOUNTER — Encounter: Payer: Self-pay | Admitting: Physician Assistant

## 2016-05-29 ENCOUNTER — Encounter: Payer: Self-pay | Admitting: Physician Assistant

## 2016-06-01 ENCOUNTER — Ambulatory Visit: Payer: Self-pay | Admitting: Internal Medicine

## 2016-06-15 ENCOUNTER — Encounter: Payer: Self-pay | Admitting: Internal Medicine

## 2016-06-15 ENCOUNTER — Ambulatory Visit (INDEPENDENT_AMBULATORY_CARE_PROVIDER_SITE_OTHER): Payer: BLUE CROSS/BLUE SHIELD | Admitting: Internal Medicine

## 2016-06-15 VITALS — BP 160/102 | HR 64 | Temp 98.2°F | Resp 18 | Ht 75.0 in | Wt 288.0 lb

## 2016-06-15 DIAGNOSIS — M1990 Unspecified osteoarthritis, unspecified site: Secondary | ICD-10-CM

## 2016-06-15 DIAGNOSIS — E349 Endocrine disorder, unspecified: Secondary | ICD-10-CM | POA: Diagnosis not present

## 2016-06-15 DIAGNOSIS — J302 Other seasonal allergic rhinitis: Secondary | ICD-10-CM | POA: Diagnosis not present

## 2016-06-15 DIAGNOSIS — E6609 Other obesity due to excess calories: Secondary | ICD-10-CM | POA: Diagnosis not present

## 2016-06-15 DIAGNOSIS — Z6835 Body mass index (BMI) 35.0-35.9, adult: Secondary | ICD-10-CM

## 2016-06-15 DIAGNOSIS — I10 Essential (primary) hypertension: Secondary | ICD-10-CM

## 2016-06-15 DIAGNOSIS — E782 Mixed hyperlipidemia: Secondary | ICD-10-CM | POA: Diagnosis not present

## 2016-06-15 DIAGNOSIS — R7303 Prediabetes: Secondary | ICD-10-CM | POA: Diagnosis not present

## 2016-06-15 DIAGNOSIS — Z79899 Other long term (current) drug therapy: Secondary | ICD-10-CM | POA: Diagnosis not present

## 2016-06-15 LAB — CBC WITH DIFFERENTIAL/PLATELET
BASOS ABS: 0 {cells}/uL (ref 0–200)
Basophils Relative: 0 %
EOS ABS: 207 {cells}/uL (ref 15–500)
Eosinophils Relative: 3 %
HCT: 44.1 % (ref 38.5–50.0)
HEMOGLOBIN: 14.9 g/dL (ref 13.2–17.1)
LYMPHS ABS: 1518 {cells}/uL (ref 850–3900)
Lymphocytes Relative: 22 %
MCH: 31.5 pg (ref 27.0–33.0)
MCHC: 33.8 g/dL (ref 32.0–36.0)
MCV: 93.2 fL (ref 80.0–100.0)
MONOS PCT: 8 %
MPV: 10.7 fL (ref 7.5–12.5)
Monocytes Absolute: 552 cells/uL (ref 200–950)
NEUTROS PCT: 67 %
Neutro Abs: 4623 cells/uL (ref 1500–7800)
Platelets: 185 10*3/uL (ref 140–400)
RBC: 4.73 MIL/uL (ref 4.20–5.80)
RDW: 13.9 % (ref 11.0–15.0)
WBC: 6.9 10*3/uL (ref 3.8–10.8)

## 2016-06-15 LAB — TSH: TSH: 2.79 m[IU]/L (ref 0.40–4.50)

## 2016-06-15 MED ORDER — METOPROLOL SUCCINATE ER 50 MG PO TB24: 100.0000 mg | ORAL_TABLET | Freq: Every day | ORAL | 11 refills | Status: DC

## 2016-06-15 MED ORDER — ATENOLOL 100 MG PO TABS: 100.0000 mg | ORAL_TABLET | Freq: Every day | ORAL | 11 refills | Status: DC

## 2016-06-15 NOTE — Progress Notes (Signed)
Assessment and Plan:  Hypertension:  -changed metoprolol xr to atenolol again -if still elevated we need to go ahead and add in HCTZ -Continue medication,  -monitor blood pressure at home.  -Continue DASH diet.   -Reminder to go to the ER if any CP, SOB, nausea, dizziness, severe HA, changes vision/speech, left arm numbness and tingling, and jaw pain.  Cholesterol: -Continue diet and exercise.  -Check cholesterol.   Pre-diabetes: -Continue diet and exercise.  -Check A1C  Vitamin D Def: -continue medications.   Morbid obesity  -keep calorie log -diet and exercise  Testosterone Def -testosterone level today   Continue diet and meds as discussed. Further disposition pending results of labs.  HPI 56 y.o. male  presents for 3 month follow up with hypertension, hyperlipidemia, prediabetes and vitamin D.   His blood pressure has been controlled at home, today their BP is BP: (!) 160/102.   He does not workout. He denies chest pain, shortness of breath, dizziness.  He reports that since he was taken off the atenolol he has had some trouble managing his blood pressure since then. He reports that he is taking the metoprolol.  He works 10 hours a day.  He reports that he has to eat early so he is not getting workout in.     He is on cholesterol medication and denies myalgias. His cholesterol is at goal. The cholesterol last visit was:   Lab Results  Component Value Date   CHOL 176 02/24/2016   HDL 48 02/24/2016   LDLCALC 113 02/24/2016   TRIG 75 02/24/2016   CHOLHDL 3.7 02/24/2016     He has been working on diet and exercise for prediabetes, and denies foot ulcerations, hyperglycemia, hypoglycemia , increased appetite, nausea, paresthesia of the feet, polydipsia, polyuria, visual disturbances, vomiting and weight loss. Last A1C in the office was:  Lab Results  Component Value Date   HGBA1C 6.3 (H) 02/24/2016    Patient is on Vitamin D supplement.  Lab Results  Component  Value Date   VD25OH 17 (L) 11/18/2015      He reports that he has been gaining a lot of weight lately.  He notes that he has been trying to make sure he is trying to eat healthy.  He has not kept any calorie logs.    Current Medications:  Current Outpatient Prescriptions on File Prior to Visit  Medication Sig Dispense Refill  . albuterol (VENTOLIN HFA) 108 (90 Base) MCG/ACT inhaler Inhale 2 puffs into the lungs every 4 (four) hours as needed for wheezing or shortness of breath. 1 Inhaler 2  . azelastine (ASTELIN) 0.1 % nasal spray Place 2 sprays into both nostrils 2 (two) times daily. Use in each nostril as directed 30 mL 2  . etodolac (LODINE) 500 MG tablet Take 500 mg by mouth 2 (two) times daily.    . fluticasone (FLONASE) 50 MCG/ACT nasal spray Place 2 sprays into both nostrils at bedtime. 16 g 1  . Multiple Vitamins-Minerals (MULTIVITAMIN WITH MINERALS) tablet Take 1 tablet by mouth daily.    . traMADol (ULTRAM) 50 MG tablet Take 2 tablets (100 mg total) by mouth every 6 (six) hours as needed. 120 tablet 0   No current facility-administered medications on file prior to visit.     Medical History:  Past Medical History:  Diagnosis Date  . Allergic rhinitis, cause unspecified   . Arthritis    neck and knees  . Hyperlipidemia   . Hypertension   .  Prediabetes     Allergies: No Known Allergies   Review of Systems:  Review of Systems  Constitutional: Negative for chills, fever and malaise/fatigue.  HENT: Negative for congestion, ear pain and sore throat.   Eyes: Negative.   Respiratory: Negative for cough, shortness of breath and wheezing.   Cardiovascular: Negative for chest pain, palpitations and leg swelling.  Gastrointestinal: Negative for abdominal pain, blood in stool, constipation, diarrhea, heartburn and melena.  Genitourinary: Negative.   Skin: Negative.   Neurological: Negative for dizziness, sensory change, loss of consciousness and headaches.   Psychiatric/Behavioral: Negative for depression. The patient is not nervous/anxious and does not have insomnia.     Family history- Review and unchanged  Social history- Review and unchanged  Physical Exam: BP (!) 160/102   Pulse 64   Temp 98.2 F (36.8 C) (Temporal)   Resp 18   Ht 6\' 3"  (1.905 m)   Wt 288 lb (130.6 kg)   BMI 36.00 kg/m  Wt Readings from Last 3 Encounters:  06/15/16 288 lb (130.6 kg)  02/27/16 284 lb 9.6 oz (129.1 kg)  02/24/16 282 lb (127.9 kg)    General Appearance: Well nourished well developed, in no apparent distress. Eyes: PERRLA, EOMs, conjunctiva no swelling or erythema ENT/Mouth: Ear canals normal without obstruction, swelling, erythma, discharge.  TMs normal bilaterally.  Oropharynx moist, clear, without exudate, or postoropharyngeal swelling. Neck: Supple, thyroid normal,no cervical adenopathy  Respiratory: Respiratory effort normal, Breath sounds clear A&P without rhonchi, wheeze, or rale.  No retractions, no accessory usage. Cardio: RRR with no MRGs. Brisk peripheral pulses without edema.  Abdomen: Soft, + BS,  Non tender, no guarding, rebound, hernias, masses. Musculoskeletal: Full ROM, 5/5 strength, Normal gait Skin: Warm, dry without rashes, lesions, ecchymosis.  Neuro: Awake and oriented X 3, Cranial nerves intact. Normal muscle tone, no cerebellar symptoms. Psych: Normal affect, Insight and Judgment appropriate.    Starlyn Skeans, PA-C 9:56 AM [facility]

## 2016-06-16 LAB — BASIC METABOLIC PANEL WITH GFR
BUN: 18 mg/dL (ref 7–25)
CALCIUM: 9.1 mg/dL (ref 8.6–10.3)
CHLORIDE: 107 mmol/L (ref 98–110)
CO2: 23 mmol/L (ref 20–31)
CREATININE: 0.99 mg/dL (ref 0.70–1.33)
GFR, Est African American: >89 mL/min (ref >=60)
GFR, Est Non African American: 87 mL/min (ref >=60)
Glucose, Bld: 132 mg/dL — ABNORMAL HIGH (ref 65–99)
Potassium: 4.4 mmol/L (ref 3.5–5.3)
Sodium: 140 mmol/L (ref 135–146)

## 2016-06-16 LAB — HEPATIC FUNCTION PANEL
ALBUMIN: 4.1 g/dL (ref 3.6–5.1)
ALT: 21 U/L (ref 9–46)
AST: 21 U/L (ref 10–35)
Alkaline Phosphatase: 82 U/L (ref 40–115)
BILIRUBIN DIRECT: 0.2 mg/dL (ref <=0.2)
Indirect Bilirubin: 0.7 mg/dL (ref 0.2–1.2)
TOTAL PROTEIN: 6.5 g/dL (ref 6.1–8.1)
Total Bilirubin: 0.9 mg/dL (ref 0.2–1.2)

## 2016-06-16 LAB — HEMOGLOBIN A1C
Hgb A1c MFr Bld: 6.7 % — ABNORMAL HIGH (ref <5.7)
Mean Plasma Glucose: 146 mg/dL

## 2016-06-16 LAB — LIPID PANEL
CHOLESTEROL: 191 mg/dL (ref <200)
HDL: 50 mg/dL (ref >40)
LDL Cholesterol: 123 mg/dL — ABNORMAL HIGH (ref <100)
TRIGLYCERIDES: 89 mg/dL (ref <150)
Total CHOL/HDL Ratio: 3.8 Ratio (ref <5.0)
VLDL: 18 mg/dL (ref <30)

## 2016-06-16 LAB — TESTOSTERONE: TESTOSTERONE: 494 ng/dL (ref 250–827)

## 2016-06-29 ENCOUNTER — Encounter: Payer: Self-pay | Admitting: Physician Assistant

## 2016-07-20 ENCOUNTER — Ambulatory Visit (INDEPENDENT_AMBULATORY_CARE_PROVIDER_SITE_OTHER): Payer: BLUE CROSS/BLUE SHIELD | Admitting: Internal Medicine

## 2016-07-20 ENCOUNTER — Encounter: Payer: Self-pay | Admitting: Internal Medicine

## 2016-07-20 VITALS — BP 140/86 | HR 88 | Temp 98.0°F | Resp 18 | Ht 75.0 in

## 2016-07-20 DIAGNOSIS — M25562 Pain in left knee: Secondary | ICD-10-CM

## 2016-07-20 MED ORDER — HYDROCODONE-ACETAMINOPHEN 5-325 MG PO TABS: 1.0000 | ORAL_TABLET | Freq: Four times a day (QID) | ORAL | 0 refills | Status: DC | PRN

## 2016-07-20 MED ORDER — PREDNISONE 20 MG PO TABS: ORAL_TABLET | ORAL | 0 refills | Status: DC

## 2016-07-20 MED ORDER — MELOXICAM 15 MG PO TABS: 15.0000 mg | ORAL_TABLET | Freq: Every day | ORAL | 1 refills | Status: DC

## 2016-07-20 NOTE — Patient Instructions (Signed)
Knee Effusion Knee effusion means that you have excess fluid in your knee joint. This can cause pain and swelling in your knee. This may make your knee more difficult to bend and move. That is because there is increased pain and pressure in the joint. If there is fluid in your knee, it often means that something is wrong inside your knee, such as severe arthritis, abnormal inflammation, or an infection. Another common cause of knee effusion is an injury to the knee muscles, ligaments, or cartilage. Follow these instructions at home:  Use crutches as directed by your health care provider.  Wear a knee brace as directed by your health care provider.  Apply ice to the swollen area:  Put ice in a plastic bag.  Place a towel between your skin and the bag.  Leave the ice on for 20 minutes, 2-3 times per day.  Keep your knee raised (elevated) when you are sitting or lying down.  Take medicines only as directed by your health care provider.  Do any rehabilitation or strengthening exercises as directed by your health care provider.  Rest your knee as directed by your health care provider. You may start doing your normal activities again when your health care provider approves.  Keep all follow-up visits as directed by your health care provider. This is important. Contact a health care provider if:  You have ongoing (persistent) pain in your knee. Get help right away if:  You have increased swelling or redness of your knee.  You have severe pain in your knee.  You have a fever. This information is not intended to replace advice given to you by your health care provider. Make sure you discuss any questions you have with your health care provider. Document Released: 07/28/2003 Document Revised: 10/13/2015 Document Reviewed: 12/21/2013 [employer] Interactive Patient Education  2017 [employer]  

## 2016-07-20 NOTE — Progress Notes (Signed)
Assessment and Plan:   1. Acute pain of left knee -likely OA causing effusion.  PERC negative. Special testing negative.  Will start prednisone taper and will send urgently to ortho.  Do not feel that there is any indication to do ultrasound to look for DVT as PERC negative and negative holmans sign with no calf tenderness or unilateral lower leg swelling.  Feel that OA is being undertreated as he ran out of etodolac and is no longer taking any antiinflammatory medications and has not had corticosteroid injection in "long time".  Patient aware to go to ER if CP, SOB, or leg redness.  He is currently working on weight loss.    - predniSONE (DELTASONE) 20 MG tablet; 3 tabs po daily x 3 days, then 2 tabs x 3 days, then 1.5 tabs x 3 days, then 1 tab x 3 days, then 0.5 tabs x 3 days  Dispense: 27 tablet; Refill: 0 - HYDROcodone-acetaminophen (NORCO) 5-325 MG tablet; Take 1 tablet by mouth every 6 (six) hours as needed for moderate pain.  Dispense: 30 tablet; Refill: 0 - meloxicam (MOBIC) 15 MG tablet; Take 1 tablet (15 mg total) by mouth daily.  Dispense: 90 tablet; Refill: 1 - Ambulatory referral to Orthopedics     HPI 56 y.o.male presents for left knee pain which has been intermittently bothersome to him for the last 2 days.  He has known osteoarthritis and has seen ortho for this in the past.  He has taken etodolac twice daily and takes tramadol.  He reports that he has been having swelling.  He reports that the pain is a 7/10 pain.  He reports that it hurts more to bend it.  He is out of the lodine.  He is taking tramadol.  He reports that he didn't take anything else.  He reports that he has some relief of pain with the tramadol but it doesn't take care of all of it.  He has seen somebody at [facility] in the past and he has had injections. He reports that he has not had an injection in his knee in years.  He has not had any ankle swelling in his feet or ankles. NO hot red or warm leg. No estrogen  use.  No recent travel.  No personal history of cancer.  No personal history of blood clots.  No unilateral leg swelling. No CP no SOB.   Past Medical History:  Diagnosis Date  . Allergic rhinitis, cause unspecified   . Arthritis    neck and knees  . Hyperlipidemia   . Hypertension   . Prediabetes      No Known Allergies    Current Outpatient Prescriptions on File Prior to Visit  Medication Sig Dispense Refill  . albuterol (VENTOLIN HFA) 108 (90 Base) MCG/ACT inhaler Inhale 2 puffs into the lungs every 4 (four) hours as needed for wheezing or shortness of breath. 1 Inhaler 2  . atenolol (TENORMIN) 100 MG tablet Take 1 tablet (100 mg total) by mouth daily. 30 tablet 11  . azelastine (ASTELIN) 0.1 % nasal spray Place 2 sprays into both nostrils 2 (two) times daily. Use in each nostril as directed 30 mL 2  . etodolac (LODINE) 500 MG tablet Take 500 mg by mouth 2 (two) times daily.    . fluticasone (FLONASE) 50 MCG/ACT nasal spray Place 2 sprays into both nostrils at bedtime. 16 g 1  . Multiple Vitamins-Minerals (MULTIVITAMIN WITH MINERALS) tablet Take 1 tablet by mouth daily.    Marland Kitchen  traMADol (ULTRAM) 50 MG tablet Take 2 tablets (100 mg total) by mouth every 6 (six) hours as needed. 120 tablet 0   No current facility-administered medications on file prior to visit.     ROS: all negative except above.   Physical Exam: There were no vitals filed for this visit. BP 140/86   Pulse 88   Temp 98 F (36.7 C) (Temporal)   Resp 18   Ht 6\' 3"  (1.905 m)  General Appearance: Well developed well nourished, non-toxic appearing in no apparent distress. Eyes: PERRLA, EOMs, conjunctiva w/ no swelling or erythema or discharge Sinuses: No Frontal/maxillary tenderness ENT/Mouth: Ear canals clear without swelling or erythema.  TM's normal bilaterally with no retractions, bulging, or loss of landmarks.   Neck: Supple, thyroid normal, no notable JVD  Respiratory: Respiratory effort normal, Clear breath  sounds anteriorly and posteriorly bilaterally without rales, rhonchi, wheezing or stridor. No retractions or accessory muscle usage. Cardio: RRR with no MRGs.   Abdomen: Soft, + BS.  Non tender, no guarding, rebound, hernias, masses.  Musculoskeletal: Left knee with mild effusion anteriorly.  Fullness notes in the posterior aspect of the left knee.  Jointline with tenderness to palapation.  Mild semimembranosis and semitendinosis.  Full passive ROM with some mild crepitus.  Full active ROM with pain with active knee flexion.  Positive patella grind test.  Negative balloting.  Negative anterior and posterior drawer.  Negative McMurrays.  Normal valgus and varus stress test.  No posterior medial tenderness to palpation.  No internal and external hip irritability.  Full ROM, 5/5 strength, normal gait.  Skin: Warm, dry without rashes  Neuro: Awake and oriented X 3, Cranial nerves intact. Normal muscle tone, no cerebellar symptoms. Sensation intact.  Psych: normal affect, Insight and Judgment appropriate.     Starlyn Skeans, PA-C 11:47 AM [facility]

## 2016-07-24 ENCOUNTER — Telehealth: Payer: Self-pay | Admitting: *Deleted

## 2016-07-24 NOTE — Telephone Encounter (Signed)
Patient called with c/o left knee pain increasing in severity.  I called to schedule appt for patient.  Dr. Marlou Sa has seen patient in the past for his left knee pain and he will eval him again 08/08/16 at 3:15pm.  Patient aware of appt.  Advised he can call and be placed on a cancellation list to see if they can evaluate his knee any sooner. Patient's wife expressed understanding.

## 2016-07-31 ENCOUNTER — Other Ambulatory Visit: Payer: Self-pay | Admitting: *Deleted

## 2016-07-31 MED ORDER — ATENOLOL 100 MG PO TABS: 100.0000 mg | ORAL_TABLET | Freq: Every day | ORAL | 3 refills | Status: DC

## 2016-08-08 ENCOUNTER — Ambulatory Visit (INDEPENDENT_AMBULATORY_CARE_PROVIDER_SITE_OTHER): Payer: BLUE CROSS/BLUE SHIELD | Admitting: Orthopedic Surgery

## 2016-08-08 ENCOUNTER — Encounter (INDEPENDENT_AMBULATORY_CARE_PROVIDER_SITE_OTHER): Payer: Self-pay | Admitting: Orthopedic Surgery

## 2016-08-08 DIAGNOSIS — M25562 Pain in left knee: Secondary | ICD-10-CM | POA: Diagnosis not present

## 2016-08-08 DIAGNOSIS — G8929 Other chronic pain: Secondary | ICD-10-CM

## 2016-08-08 NOTE — Progress Notes (Signed)
Office Visit Note   Patient: Dale Tapia           Date of Birth: November 11, 1960           MRN: [id number] Visit Date: 08/08/2016 Requested by: Unk Pinto, MD [address] [place], [place] [address] PCP: Alesia Richards, MD  Subjective: Chief Complaint  Patient presents with  . Left Knee - Pain    HPI: Dale Tapia is a 56 year old patient with left knee pain.  Reports some posterior swelling that started 2 weeks ago.  Denies much in the way of discrete pain but he does report some popping and locking.  He has been taking prednisone.  He thinks he may have a Baker cyst.  Radiographs in 2017 showed some medial compartment arthritis bilaterally.  He does do physical type or having to be on his feet for 10 hours a day going up and down steps.  States his right knee is a little bit sore.  He doesn't history of a cortisone shot a year ago.  Denies any history of gout.              ROS: All systems reviewed are negative as they relate to the chief complaint within the history of present illness.  Patient denies  fevers or chills.   Assessment & Plan: Visit Diagnoses:  1. Chronic pain of left knee     Plan: Impression left knee pain.  Plan did ultrasound on that left leg he definitely does not have a Baker cyst.  He has no effusion and no focal joint line tenderness.  At this point in time I think his main reason for evaluation was so you could see if he had anything going on in the back of his knee.  His exam is normal ultrasound exam also normal in that regard.  I would favor follow-up in 6-8 weeks if he is not improved.  Could consider repeat cortisone injection into the knee at that time.  I think a lot of this may be the progression of medial compartment arthritis giving him posterior capsular tightness and pain all see him back as needed  Follow-Up Instructions: No Follow-up on file.   Orders:  No orders of the defined types were placed in this encounter.  No  orders of the defined types were placed in this encounter.     Procedures: No procedures performed   Clinical Data: No additional findings.  Objective: Vital Signs: There were no vitals taken for this visit.  Physical Exam:   Constitutional: Patient appears well-developed HEENT:  Head: Normocephalic Eyes:EOM are normal Neck: Normal range of motion Cardiovascular: Normal rate Pulmonary/chest: Effort normal Neurologic: Patient is alert Skin: Skin is warm Psychiatric: Patient has normal mood and affect    Ortho Exam: Orthopedic exam demonstrates normal gait and alignment normal by mass index full active and passive range of motion of the left knee was stable collateral crucial ligaments intact extensor mechanism palpable pedal pulses no groin pain with internal/external rotation of the leg negative McMurray compression testing intact extensor mechanism no focal joint line tenderness no other masses lymph adenopathy or skin changes noted in the knee region particular posteriorly  Specialty Comments:  No specialty comments available.  Imaging: No results found.   PMFS History: Patient Active Problem List   Diagnosis Date Noted  . Obesity 05/03/2014  . Prediabetes   . Hypertension   . Arthritis   . Allergic rhinitis   . Hyperlipidemia  Past Medical History:  Diagnosis Date  . Allergic rhinitis, cause unspecified   . Arthritis    neck and knees  . Hyperlipidemia   . Hypertension   . Prediabetes     Family History  Problem Relation Age of Onset  . Colon cancer Neg Hx   . Hypertension Mother   . Arthritis Mother   . Sleep apnea Mother   . Hypertension Father   . Kidney disease Father     No past surgical history on file. Social History   Occupational History  . Not on file.   Social History Main Topics  . Smoking status: Never Smoker  . Smokeless tobacco: Never Used  . Alcohol use No  . Drug use: No  . Sexual activity: Not on file

## 2016-09-10 ENCOUNTER — Encounter: Payer: Self-pay | Admitting: Physician Assistant

## 2016-09-10 ENCOUNTER — Ambulatory Visit (INDEPENDENT_AMBULATORY_CARE_PROVIDER_SITE_OTHER): Payer: BLUE CROSS/BLUE SHIELD | Admitting: Physician Assistant

## 2016-09-10 VITALS — BP 136/90 | HR 73 | Temp 97.7°F | Resp 16 | Ht 75.0 in | Wt 290.0 lb

## 2016-09-10 DIAGNOSIS — J069 Acute upper respiratory infection, unspecified: Secondary | ICD-10-CM

## 2016-09-10 MED ORDER — PREDNISONE 20 MG PO TABS: ORAL_TABLET | ORAL | 0 refills | Status: DC

## 2016-09-10 MED ORDER — PROMETHAZINE-DM 6.25-15 MG/5ML PO SYRP: 5.0000 mL | ORAL_SOLUTION | Freq: Four times a day (QID) | ORAL | 1 refills | Status: DC | PRN

## 2016-09-10 MED ORDER — AZITHROMYCIN 250 MG PO TABS: ORAL_TABLET | ORAL | 1 refills | Status: AC

## 2016-09-10 NOTE — Progress Notes (Signed)
Subjective:    Patient ID: Dale Tapia, male    DOB: [birth year], 56 y.o.   MRN: [id number]  HPI 56 y.o. obese WM presents with cold symptoms x Saturday, no OTC meds, no fever or chills. Sore throat, cough without mucus, sinus congestion. No SOB, CP.   Medications Current Outpatient Prescriptions on File Prior to Visit  Medication Sig  . albuterol (VENTOLIN HFA) 108 (90 Base) MCG/ACT inhaler Inhale 2 puffs into the lungs every 4 (four) hours as needed for wheezing or shortness of breath.  Marland Kitchen atenolol (TENORMIN) 100 MG tablet Take 1 tablet (100 mg total) by mouth daily.  Marland Kitchen azelastine (ASTELIN) 0.1 % nasal spray Place 2 sprays into both nostrils 2 (two) times daily. Use in each nostril as directed  . etodolac (LODINE) 500 MG tablet Take 500 mg by mouth 2 (two) times daily.  . fluticasone (FLONASE) 50 MCG/ACT nasal spray Place 2 sprays into both nostrils at bedtime.  Marland Kitchen HYDROcodone-acetaminophen (NORCO) 5-325 MG tablet Take 1 tablet by mouth every 6 (six) hours as needed for moderate pain.  . meloxicam (MOBIC) 15 MG tablet Take 1 tablet (15 mg total) by mouth daily.  . Multiple Vitamins-Minerals (MULTIVITAMIN WITH MINERALS) tablet Take 1 tablet by mouth daily.  . traMADol (ULTRAM) 50 MG tablet Take 2 tablets (100 mg total) by mouth every 6 (six) hours as needed.   No current facility-administered medications on file prior to visit.     Problem list He has Hypertension; Arthritis; Allergic rhinitis; Hyperlipidemia; Prediabetes; and Obesity on his problem list.  Review of Systems  Constitutional: Negative for chills, diaphoresis and fever.  HENT: Positive for congestion, sinus pressure, sneezing and sore throat. Negative for ear pain, trouble swallowing and voice change.   Eyes: Negative.   Respiratory: Positive for cough. Negative for chest tightness, shortness of breath and wheezing.   Cardiovascular: Negative.   Gastrointestinal: Negative.   Genitourinary: Negative.    Musculoskeletal: Negative for neck pain.  Neurological: Negative.  Negative for headaches.       Objective:   Physical Exam  Constitutional: He is oriented to person, place, and time. He appears well-developed and well-nourished.  HENT:  Head: Normocephalic and atraumatic.  Right Ear: Hearing and tympanic membrane normal.  Left Ear: Hearing and tympanic membrane normal.  Nose: Right sinus exhibits maxillary sinus tenderness. Left sinus exhibits maxillary sinus tenderness.  Mouth/Throat: Uvula is midline and mucous membranes are normal. Posterior oropharyngeal erythema present. No oropharyngeal exudate, posterior oropharyngeal edema or tonsillar abscesses.  Eyes: Conjunctivae are normal. Pupils are equal, round, and reactive to light.  Neck: Normal range of motion. Neck supple.  Cardiovascular: Normal rate and regular rhythm.   Pulmonary/Chest: Effort normal and breath sounds normal.  Abdominal: Soft. Bowel sounds are normal.  Musculoskeletal: Normal range of motion.  Lymphadenopathy:    He has no cervical adenopathy.  Neurological: He is alert and oriented to person, place, and time.  Skin: Skin is warm and dry. No rash noted.       Assessment & Plan:  1. Acute URI Will hold the zpak and take if she is not getting better, increase fluids, rest, cont allergy pill - predniSONE (DELTASONE) 20 MG tablet; 2 tablets daily for 3 days, 1 tablet daily for 4 days.  Dispense: 10 tablet; Refill: 0 - azithromycin (ZITHROMAX) 250 MG tablet; Take 2 tablets (500 mg) on  Day 1,  followed by 1 tablet (250 mg) once daily on Days 2 through 5.  Dispense:  6 each; Refill: 1 - promethazine-dextromethorphan (PROMETHAZINE-DM) 6.25-15 MG/5ML syrup; Take 5 mLs by mouth 4 (four) times daily as needed for cough.  Dispense: 240 mL; Refill: 1

## 2016-09-10 NOTE — Patient Instructions (Signed)

## 2016-09-14 ENCOUNTER — Ambulatory Visit: Payer: Self-pay | Admitting: Internal Medicine

## 2016-10-18 NOTE — Progress Notes (Signed)
Assessment and Plan:  Hypertension:  -Continue medication,  -monitor blood pressure at home.  -Continue DASH diet.   -Reminder to go to the ER if any CP, SOB, nausea, dizziness, severe HA, changes vision/speech, left arm numbness and tingling, and jaw pain.  Cholesterol: -Continue diet and exercise.  -Check cholesterol.   Pre-diabetes: -Continue diet and exercise.  -Check A1C  Vitamin D Def: -continue medications.   Morbid obesity  -keep calorie log -diet and exercise  Testosterone Def -Hypogonadism- continue replacement therapy, check testosterone levels as needed.    Continue diet and meds as discussed. Further disposition pending results of labs.  HPI 56 y.o. male  presents for 3 month follow up with hypertension, hyperlipidemia, prediabetes and vitamin D.   His blood pressure has been controlled at home, today their BP is BP: 130/80.   He does not workout due to working a lot and left knee pain. He denies chest pain, shortness of breath, dizziness.    He is on cholesterol medication and denies myalgias. His cholesterol is at goal. The cholesterol last visit was:   Lab Results  Component Value Date   CHOL 191 06/15/2016   HDL 50 06/15/2016   LDLCALC 123 (H) 06/15/2016   TRIG 89 06/15/2016   CHOLHDL 3.8 06/15/2016    He has been working on diet and exercise for diabetes, last visit was in DM range, and denies foot ulcerations, hyperglycemia, hypoglycemia , increased appetite, nausea, paresthesia of the feet, polydipsia, polyuria, visual disturbances, vomiting and weight loss. Last A1C in the office was:  Lab Results  Component Value Date   HGBA1C 6.7 (H) 06/15/2016   Patient is NOT on Vitamin D supplement.  Lab Results  Component Value Date   VD25OH 17 (L) 11/18/2015     BMI is Body mass index is 35.52 kg/m., he is working on diet and exercise. Wt Readings from Last 3 Encounters:  10/19/16 284 lb 3.2 oz (128.9 kg)  09/10/16 290 lb (131.5 kg)  06/15/16 288 lb  (130.6 kg)    Current Medications:  Current Outpatient Prescriptions on File Prior to Visit  Medication Sig Dispense Refill  . albuterol (VENTOLIN HFA) 108 (90 Base) MCG/ACT inhaler Inhale 2 puffs into the lungs every 4 (four) hours as needed for wheezing or shortness of breath. 1 Inhaler 2  . atenolol (TENORMIN) 100 MG tablet Take 1 tablet (100 mg total) by mouth daily. 30 tablet 3  . azelastine (ASTELIN) 0.1 % nasal spray Place 2 sprays into both nostrils 2 (two) times daily. Use in each nostril as directed 30 mL 2  . etodolac (LODINE) 500 MG tablet Take 500 mg by mouth 2 (two) times daily.    . meloxicam (MOBIC) 15 MG tablet Take 1 tablet (15 mg total) by mouth daily. 90 tablet 1  . Multiple Vitamins-Minerals (MULTIVITAMIN WITH MINERALS) tablet Take 1 tablet by mouth daily.    . traMADol (ULTRAM) 50 MG tablet Take 2 tablets (100 mg total) by mouth every 6 (six) hours as needed. 120 tablet 0   No current facility-administered medications on file prior to visit.     Medical History:  Past Medical History:  Diagnosis Date  . Allergic rhinitis, cause unspecified   . Arthritis    neck and knees  . Hyperlipidemia   . Hypertension   . Prediabetes     Allergies: No Known Allergies   Review of Systems:  Review of Systems  Constitutional: Negative for chills, fever and malaise/fatigue.  HENT: Negative  for congestion, ear pain and sore throat.   Eyes: Negative.   Respiratory: Negative for cough, shortness of breath and wheezing.   Cardiovascular: Negative for chest pain, palpitations and leg swelling.  Gastrointestinal: Negative for abdominal pain, blood in stool, constipation, diarrhea, heartburn and melena.  Genitourinary: Negative.   Skin: Negative.   Neurological: Negative for dizziness, sensory change, loss of consciousness and headaches.  Psychiatric/Behavioral: Negative for depression. The patient is not nervous/anxious and does not have insomnia.     Family history- Review  and unchanged  Social history- Review and unchanged  Physical Exam: BP 130/80   Pulse 62   Temp 97.7 F (36.5 C)   Resp 18   Ht 6\' 3"  (1.905 m)   Wt 284 lb 3.2 oz (128.9 kg)   SpO2 98%   BMI 35.52 kg/m  Wt Readings from Last 3 Encounters:  10/19/16 284 lb 3.2 oz (128.9 kg)  09/10/16 290 lb (131.5 kg)  06/15/16 288 lb (130.6 kg)    General Appearance: Well nourished well developed, in no apparent distress. Eyes: PERRLA, EOMs, conjunctiva no swelling or erythema ENT/Mouth: Ear canals normal without obstruction, swelling, erythma, discharge.  TMs normal bilaterally.  Oropharynx moist, clear, without exudate, or postoropharyngeal swelling. Neck: Supple, thyroid normal,no cervical adenopathy  Respiratory: Respiratory effort normal, Breath sounds clear A&P without rhonchi, wheeze, or rale.  No retractions, no accessory usage. Cardio: RRR with no MRGs. Brisk peripheral pulses without edema.  Abdomen: Soft, + BS,  Non tender, no guarding, rebound, hernias, masses. Musculoskeletal: Full ROM, 5/5 strength, Normal gait Skin: Warm, dry without rashes, lesions, ecchymosis.  Neuro: Awake and oriented X 3, Cranial nerves intact. Normal muscle tone, no cerebellar symptoms. Psych: Normal affect, Insight and Judgment appropriate.    Vicie Mutters, PA-C 11:01 AM [facility]

## 2016-10-19 ENCOUNTER — Encounter: Payer: Self-pay | Admitting: Physician Assistant

## 2016-10-19 ENCOUNTER — Ambulatory Visit (INDEPENDENT_AMBULATORY_CARE_PROVIDER_SITE_OTHER): Payer: BLUE CROSS/BLUE SHIELD | Admitting: Physician Assistant

## 2016-10-19 VITALS — BP 130/80 | HR 62 | Temp 97.7°F | Resp 18 | Ht 75.0 in | Wt 284.2 lb

## 2016-10-19 DIAGNOSIS — R7303 Prediabetes: Secondary | ICD-10-CM

## 2016-10-19 DIAGNOSIS — Z6835 Body mass index (BMI) 35.0-35.9, adult: Secondary | ICD-10-CM | POA: Diagnosis not present

## 2016-10-19 DIAGNOSIS — E782 Mixed hyperlipidemia: Secondary | ICD-10-CM | POA: Diagnosis not present

## 2016-10-19 DIAGNOSIS — I10 Essential (primary) hypertension: Secondary | ICD-10-CM

## 2016-10-19 DIAGNOSIS — Z79899 Other long term (current) drug therapy: Secondary | ICD-10-CM | POA: Diagnosis not present

## 2016-10-19 LAB — CBC WITH DIFFERENTIAL/PLATELET
BASOS ABS: 70 {cells}/uL (ref 0–200)
Basophils Relative: 1 %
EOS ABS: 210 {cells}/uL (ref 15–500)
Eosinophils Relative: 3 %
HEMATOCRIT: 46.3 % (ref 38.5–50.0)
Hemoglobin: 15.5 g/dL (ref 13.2–17.1)
LYMPHS PCT: 22 %
Lymphs Abs: 1540 cells/uL (ref 850–3900)
MCH: 30.7 pg (ref 27.0–33.0)
MCHC: 33.5 g/dL (ref 32.0–36.0)
MCV: 91.7 fL (ref 80.0–100.0)
MONOS PCT: 10 %
MPV: 10.9 fL (ref 7.5–12.5)
Monocytes Absolute: 700 cells/uL (ref 200–950)
NEUTROS ABS: 4480 {cells}/uL (ref 1500–7800)
Neutrophils Relative %: 64 %
PLATELETS: 195 10*3/uL (ref 140–400)
RBC: 5.05 MIL/uL (ref 4.20–5.80)
RDW: 13.6 % (ref 11.0–15.0)
WBC: 7 10*3/uL (ref 3.8–10.8)

## 2016-10-19 LAB — BASIC METABOLIC PANEL WITH GFR
BUN: 21 mg/dL (ref 7–25)
CO2: 26 mmol/L (ref 20–31)
CREATININE: 1.06 mg/dL (ref 0.70–1.33)
Calcium: 9.3 mg/dL (ref 8.6–10.3)
Chloride: 102 mmol/L (ref 98–110)
GFR, Est African American: >89 mL/min (ref >=60)
GFR, Est Non African American: 79 mL/min (ref >=60)
GLUCOSE: 124 mg/dL — AB (ref 65–99)
Potassium: 4.5 mmol/L (ref 3.5–5.3)
Sodium: 138 mmol/L (ref 135–146)

## 2016-10-19 LAB — HEPATIC FUNCTION PANEL
ALT: 30 U/L (ref 9–46)
AST: 28 U/L (ref 10–35)
Albumin: 4.2 g/dL (ref 3.6–5.1)
Alkaline Phosphatase: 94 U/L (ref 40–115)
Bilirubin, Direct: 0.2 mg/dL (ref <=0.2)
Indirect Bilirubin: 1.2 mg/dL (ref 0.2–1.2)
TOTAL PROTEIN: 6.5 g/dL (ref 6.1–8.1)
Total Bilirubin: 1.4 mg/dL — ABNORMAL HIGH (ref 0.2–1.2)

## 2016-10-19 LAB — LIPID PANEL
CHOLESTEROL: 198 mg/dL (ref <200)
HDL: 47 mg/dL (ref >40)
LDL Cholesterol: 132 mg/dL — ABNORMAL HIGH (ref <100)
Total CHOL/HDL Ratio: 4.2 Ratio (ref <5.0)
Triglycerides: 95 mg/dL (ref <150)
VLDL: 19 mg/dL (ref <30)

## 2016-10-19 NOTE — Patient Instructions (Signed)
Simple math prevails.    1st - exercise does not produce significant weight loss - at best one converts fat into muscle , "bulks up", loses inches, but usually stays "weight neutral"     2nd - think of your body weightas a check book: If you eat more calories than you burn up - you save money or gain weight .... Or if you spend more money than you put in the check book, ie burn up more calories than you eat, then you lose weight     3rd - if you walk or run 1 mile, you burn up 100 calories - you have to burn up 3,500 calories to lose 1 pound, ie you have to walk/run 35 miles to lose 1 measly pound. So if you want to lose 10 #, then you have to walk/run 350 miles, so.... clearly exercise is not the solution.     4. So if you consume 1,500 calories, then you have to burn up the equivalent of 15 miles to stay weight neutral - It also stands to reason that if you consume 1,500 cal/day and don't lose weight, then you must be burning up about 1,500 cals/day to stay weight neutral.     5. If you really want to lose weight, you must cut your calorie intake 300 calories /day and at that rate you should lose about 1 # every 3 days.   6. Please purchase Dr Fara Olden Fuhrman's book(s) "The End of Dieting" & "Eat to Live" . It has some great concepts and recipes.     Eat 3 meals a day- protein shake is good Make sure you are drinking 100 oz of water a day Try cucumbers or veggies as snacks  We want weight loss that will last so you should lose 1-2 pounds a week.  THAT IS IT! Please pick THREE things a month to change. Once it is a habit check off the item. Then pick another three items off the list to become habits.  If you are already doing a habit on the list GREAT!  Cross that item off! o Don't drink your calories. Ie, alcohol, soda, fruit juice, and sweet tea.  o Drink more water. Drink a glass when you feel hungry or before each meal.  o Eat breakfast - Complex carb and protein (likeDannon light and fit  yogurt, oatmeal, fruit, eggs, [place] bacon). o Measure your cereal.  Eat no more than one cup a day. (ie [place]) o Eat an apple a day. o Add a vegetable a day. o Try a new vegetable a month. o Use Pam! Stop using oil or butter to cook. o Don't finish your plate or use smaller plates. o Share your dessert. o Eat sugar free Jello for dessert or frozen grapes. o Don't eat 2-3 hours before bed. o Switch to whole wheat bread, pasta, and brown rice. o Make healthier choices when you eat out. No fries! o Pick baked chicken, NOT fried. o Don't forget to SLOW DOWN when you eat. It is not going anywhere.  o Take the stairs. o Park far away in the parking lot o [employer] (or weights) for 10 minutes while watching TV. o Walk at work for 10 minutes during break. o Walk outside 1 time a week with your friend, kids, dog, or significant other. o Start a walking group at [place] the mall as much as you can tolerate.  o Keep a food diary. o Weigh yourself  daily. o Walk for 15 minutes 3 days per week. o Cook at home more often and eat out less.  If life happens and you go back to old habits, it is okay.  Just start over. You can do it!   If you experience chest pain, get short of breath, or tired during the exercise, please stop immediately and inform your doctor.      Bad carbs also include fruit juice, alcohol, and sweet tea. These are empty calories that do not signal to your brain that you are full.   Please remember the good carbs are still carbs which convert into sugar. So please measure them out no more than 1/2-1 cup of rice, oatmeal, pasta, and beans  Veggies are however free foods! Pile them on.   Not all fruit is created equal. Please see the list below, the fruit at the bottom is higher in sugars than the fruit at the top. Please avoid all dried fruits.

## 2016-10-20 LAB — MAGNESIUM: MAGNESIUM: 2 mg/dL (ref 1.5–2.5)

## 2016-10-20 LAB — HEMOGLOBIN A1C
HEMOGLOBIN A1C: 7.2 % — AB (ref <5.7)
MEAN PLASMA GLUCOSE: 160 mg/dL

## 2016-10-20 LAB — TSH: TSH: 2.17 m[IU]/L (ref 0.40–4.50)

## 2016-10-22 NOTE — Progress Notes (Signed)
LVM for pt to return office call for LAB results.

## 2016-10-24 NOTE — Progress Notes (Signed)
Pt aware of lab results & voiced understanding of those results.

## 2016-10-30 ENCOUNTER — Encounter: Payer: Self-pay | Admitting: Internal Medicine

## 2016-11-23 ENCOUNTER — Encounter: Payer: Self-pay | Admitting: Internal Medicine

## 2017-01-11 ENCOUNTER — Encounter (INDEPENDENT_AMBULATORY_CARE_PROVIDER_SITE_OTHER): Payer: Self-pay | Admitting: Family

## 2017-01-11 ENCOUNTER — Ambulatory Visit (INDEPENDENT_AMBULATORY_CARE_PROVIDER_SITE_OTHER): Payer: BLUE CROSS/BLUE SHIELD | Admitting: Family

## 2017-01-11 VITALS — Ht 75.0 in | Wt 284.0 lb

## 2017-01-11 DIAGNOSIS — M1712 Unilateral primary osteoarthritis, left knee: Secondary | ICD-10-CM | POA: Diagnosis not present

## 2017-01-11 DIAGNOSIS — M1711 Unilateral primary osteoarthritis, right knee: Secondary | ICD-10-CM

## 2017-01-11 DIAGNOSIS — M170 Bilateral primary osteoarthritis of knee: Secondary | ICD-10-CM

## 2017-01-11 MED ORDER — LIDOCAINE HCL 1 % IJ SOLN
5.0000 mL | INTRAMUSCULAR | Status: AC | PRN
  Administered 2017-01-11: 5 mL

## 2017-01-11 MED ORDER — METHYLPREDNISOLONE ACETATE 40 MG/ML IJ SUSP
40.0000 mg | INTRAMUSCULAR | Status: AC | PRN
  Administered 2017-01-11: 40 mg via INTRA_ARTICULAR

## 2017-01-11 NOTE — Progress Notes (Signed)
Office Visit Note   Patient: Dale Tapia           Date of Birth: April 15, 1961           MRN: [id number] Visit Date: 01/11/2017 Requested by: Unk Pinto, [address] [place], [place] [address] PCP: Unk Pinto, MD  Subjective: Chief Complaint  Patient presents with  . Left Knee - Pain  . Right Knee - Pain    HPI: Dale Tapia is a 56 year old patient with chronic bilateral knee pain. Today reports bilateral anteriomedial knee pain, some posterior pain as well. He does report some popping and locking.    Radiographs in 2017 showed some medial compartment arthritis bilaterally.  He does do physical type or having to be on his feet for 10 hours a day going up and down steps.  Pain descending hills.  painful ambulation with decreased flexion.              ROS: All systems reviewed are negative as they relate to the chief complaint within the history of present illness.  Patient denies  fevers or chills.   Assessment & Plan: Visit Diagnoses:  1. Bilateral primary osteoarthritis of knee     Plan: Bilateral Depo-Medrol injections to bilateral knees will follow-up in office as needed.  Follow-Up Instructions: Return if symptoms worsen or fail to improve.   Orders:  No orders of the defined types were placed in this encounter.  No orders of the defined types were placed in this encounter.     Procedures: Large Joint Inj Date/Time: 01/11/2017 1:25 PM Performed by: Suzan Slick Authorized by: Dondra Prader R   Consent Given by:  Patient Site marked: the procedure site was marked   Timeout: prior to procedure the correct patient, procedure, and site was verified   Indications:  Pain and diagnostic evaluation Location:  Knee Site:  L knee Needle Size:  22 G Needle Length:  1.5 inches Ultrasound Guidance: No   Fluoroscopic Guidance: No   Arthrogram: No   Medications:  5 mL lidocaine 1 %; 40 mg methylPREDNISolone acetate 40 MG/ML Aspiration  Attempted: No   Patient tolerance:  Patient tolerated the procedure well with no immediate complications  Large Joint Inj Date/Time: 01/11/2017 1:26 PM Performed by: Suzan Slick Authorized by: Dondra Prader R   Consent Given by:  Patient Site marked: the procedure site was marked   Timeout: prior to procedure the correct patient, procedure, and site was verified   Indications:  Pain and diagnostic evaluation Location:  Knee Site:  R knee Needle Size:  22 G Needle Length:  1.5 inches Ultrasound Guidance: No   Fluoroscopic Guidance: No   Arthrogram: No   Medications:  5 mL lidocaine 1 %; 40 mg methylPREDNISolone acetate 40 MG/ML Aspiration Attempted: No   Patient tolerance:  Patient tolerated the procedure well with no immediate complications     Clinical Data: No additional findings.  Objective: Vital Signs: Ht 6\' 3"  (1.905 m)   Wt 284 lb (128.8 kg)   BMI 35.50 kg/m   Physical Exam:   Constitutional: Patient appears well-developed HEENT:  Head: Normocephalic Eyes:EOM are normal Neck: Normal range of motion Cardiovascular: Normal rate Pulmonary/chest: Effort normal Neurologic: Patient is alert Skin: Skin is warm Psychiatric: Patient has normal mood and affect    Ortho Exam: Orthopedic exam demonstrates normal gait and alignment normal body mass index full active and passive range of motion of the knees was stable collateral crucial ligaments  intact extensor mechanism palpable pedal pulses no groin pain with internal/external rotation of the leg negative McMurray compression testing intact extensor mechanism. medial joint line tenderness bilaterally.   Specialty Comments:  No specialty comments available.  Imaging: No results found.   PMFS History: Patient Active Problem List   Diagnosis Date Noted  . Obesity 05/03/2014  . Prediabetes   . Hypertension   . Arthritis   . Allergic rhinitis   . Hyperlipidemia    Past Medical History:  Diagnosis Date  .  Allergic rhinitis, cause unspecified   . Arthritis    neck and knees  . Hyperlipidemia   . Hypertension   . Prediabetes     Family History  Problem Relation Age of Onset  . Colon cancer Neg Hx   . Hypertension Mother   . Arthritis Mother   . Sleep apnea Mother   . Hypertension Father   . Kidney disease Father     No past surgical history on file. Social History   Occupational History  . Not on file.   Social History Main Topics  . Smoking status: Never Smoker  . Smokeless tobacco: Never Used  . Alcohol use No  . Drug use: No  . Sexual activity: Not on file

## 2017-01-30 NOTE — Progress Notes (Signed)
Complete Physical  Assessment and Plan:  Essential hypertension - continue medications, DASH diet, exercise and monitor at home. Call if greater than 130/80.  With sinus brady and weight loss will cut atenolol in half and monitor bp at home -     CBC with Differential/Platelet -     BASIC METABOLIC PANEL WITH GFR -     Hepatic function panel -     TSH -     Microalbumin / creatinine urine ratio -     Urinalysis w microscopic + reflex cultur -     EKG 12-Lead  Seasonal allergic rhinitis, unspecified trigger Continue OTC meds  Arthritis Better with weight loss, follows ortho for knees  Mixed hyperlipidemia -     Lipid panel -continue medications, check lipids, decrease fatty foods, increase activity.   Prediabetes -     Hemoglobin A1c Great job with weight loss, will recheck, likely out of DM range - continue aASA, no ACE/ARB at this time  Class 2 severe obesity due to excess calories with serious comorbidity and body mass index (BMI) of 35.0 to 35.9 in adult [facility]) Morbid Obesity with co morbidities - long discussion about weight loss, diet, and exercise  Medication management -     Magnesium  Vitamin D deficiency -     VITAMIN D 25 Hydroxy (Vit-D Deficiency, Fractures)  Screening, anemia, deficiency, iron -     Iron,Total/Total Iron Binding Cap -     Vitamin B12  Encounter for general adult medical examination with abnormal findings 1 year  Testosterone deficiency -     Testosterone  GERD Benign AB, no fever chills Cut back on mobic Get on zantac  Call if symptoms change  Discussed med's effects and SE's. Screening labs and tests as requested with regular follow-up as recommended. Future Appointments Date Time Provider [place]  02/28/2018 9:30 AM Vicie Mutters, PA-C [facility] None    HPI Patient presents for a complete physical.   His blood pressure has been controlled at home, today their BP is BP: 122/68 He does not workout. He denies  chest pain, shortness of breath, dizziness.    Has had diarrhea/epigastric pain x 1 week, wife too, has had dark but no black stool no blood in stool.  Has had congestion x Sunday, kids are sick too, no fever or chills.  He is on cholesterol medication and denies myalgias. His cholesterol is at goal. The cholesterol last visit was:   Lab Results  Component Value Date   CHOL 198 10/19/2016   HDL 47 10/19/2016   LDLCALC 132 (H) 10/19/2016   TRIG 95 10/19/2016   CHOLHDL 4.2 10/19/2016   He has been working on diet and exercise for diabetes, he is not on bASA, he is NOT on ACE/ARB and denies foot ulcerations, hyperglycemia, hypoglycemia , increased appetite, nausea, paresthesia of the feet, polydipsia, polyuria, visual disturbances, vomiting and weight loss. Last A1C in the office was:  Lab Results  Component Value Date   HGBA1C 7.2 (H) 10/19/2016   Lab Results  Component Value Date   GFRNONAA 79 10/19/2016   Patient is on Vitamin D supplement.   Lab Results  Component Value Date   VD25OH 17 (L) 11/18/2015   He is on thyroid medication. His medication was not changed last visit.   Lab Results  Component Value Date   TSH 2.17 10/19/2016  .    Last PSA was: Lab Results  Component Value Date   PSA 0.47 11/18/2015  BMI is Body mass index is 32.02 kg/m., he is working on diet and exercise, he has been doing Research scientist (life sciences).  Wt Readings from Last 3 Encounters:  02/01/17 259 lb 9.6 oz (117.8 kg)  01/11/17 284 lb (128.8 kg)  10/19/16 284 lb 3.2 oz (128.9 kg)      Current Medications:  Current Outpatient Prescriptions on File Prior to Visit  Medication Sig Dispense Refill  . albuterol (VENTOLIN HFA) 108 (90 Base) MCG/ACT inhaler Inhale 2 puffs into the lungs every 4 (four) hours as needed for wheezing or shortness of breath. 1 Inhaler 2  . atenolol (TENORMIN) 100 MG tablet Take 1 tablet (100 mg total) by mouth daily. 30 tablet 3  . etodolac (LODINE) 500 MG tablet Take 500 mg by mouth 2  (two) times daily.    . meloxicam (MOBIC) 15 MG tablet Take 1 tablet (15 mg total) by mouth daily. 90 tablet 1  . Multiple Vitamins-Minerals (MULTIVITAMIN WITH MINERALS) tablet Take 1 tablet by mouth daily.    . traMADol (ULTRAM) 50 MG tablet Take 2 tablets (100 mg total) by mouth every 6 (six) hours as needed. 120 tablet 0  . azelastine (ASTELIN) 0.1 % nasal spray Place 2 sprays into both nostrils 2 (two) times daily. Use in each nostril as directed 30 mL 2   No current facility-administered medications on file prior to visit.     Health Maintenance:  Immunization History  Administered Date(s) Administered  . Td 06/12/2005  . Tdap 01/18/2012    Tetanus: 2013 Flu vaccine:Declined Zostavax: Not indicated Colonoscopy: 2014 Eye Exam: 2017, Dr. [place] Dentist: Twice yearly visits  Patient Care Team: Unk [place], MD as PCP - General (Internal Medicine) Irene Shipper, MD as Consulting Physician (Gastroenterology)  Medical History:  Past Medical History:  Diagnosis Date  . Allergic rhinitis, cause unspecified   . Arthritis    neck and knees  . Hyperlipidemia   . Hypertension   . Prediabetes    Allergies No Known Allergies  SURGICAL HISTORY He  has no past surgical history on file. FAMILY HISTORY His family history includes Arthritis in his mother; Hypertension in his father and mother; Kidney disease in his father; Sleep apnea in his mother. SOCIAL HISTORY He  reports that he has never smoked. He has never used smokeless tobacco. He reports that he does not drink alcohol or use drugs.   Review of Systems:  Review of Systems  Constitutional: Negative for chills, fever and malaise/fatigue.  HENT: Negative for congestion, ear pain and sore throat.   Respiratory: Negative for cough, shortness of breath and wheezing.   Cardiovascular: Negative for chest pain, palpitations and leg swelling.  Gastrointestinal: Positive for abdominal pain and diarrhea. Negative for blood in  stool, constipation, heartburn, melena, nausea and vomiting.  Genitourinary: Negative.   Neurological: Negative for dizziness, sensory change, loss of consciousness and headaches.  Psychiatric/Behavioral: Negative for depression and memory loss. The patient is not nervous/anxious and does not have insomnia.     Physical Exam: Estimated body mass index is 32.02 kg/m as calculated from the following:   Height as of this encounter: 6' 3.5" (1.918 m).   Weight as of this encounter: 259 lb 9.6 oz (117.8 kg). BP 122/68   Pulse 63   Temp 97.7 F (36.5 C)   Resp 14   Ht 6' 3.5" (1.918 m)   Wt 259 lb 9.6 oz (117.8 kg)   SpO2 97%   BMI 32.02 kg/m   General Appearance: Well  nourished, in no apparent distress.  Eyes: PERRLA, EOMs, conjunctiva no swelling or erythema ENT/Mouth: Ear canals clear bilaterally with no erythema, swelling, discharge.  TMs normal bilaterally with no erythema, bulging, or retractions.  Oropharynx clear and moist with no exudate, swelling, or erythema.  Dentition normal.   Neck: Supple, thyroid normal. No bruits, JVD, cervical adenopathy Respiratory: Respiratory effort normal, BS equal bilaterally without rales, rhonchi, wheezing or stridor.  Cardio: RRR without murmurs, rubs or gallops. Brisk peripheral pulses without edema.  Chest: symmetric, with normal excursions Abdomen: Soft, nontender, no guarding, rebound, hernias, masses, or organomegaly. Musculoskeletal: Full ROM all peripheral extremities,5/5 strength, and normal gait.  Skin: Warm, dry without rashes, lesions, ecchymosis. Neuro: A&Ox3, Cranial nerves intact, reflexes equal bilaterally. Normal muscle tone, no cerebellar symptoms. Sensation intact.  Psych: Normal affect, Insight and Judgment appropriate.   EKG: WNL no changes.  AORTA SCAN: WNL  Over 40 minutes of exam, counseling, chart review and critical decision making was performed  Vicie Mutters 9:40 AM [facility]

## 2017-02-01 ENCOUNTER — Encounter: Payer: Self-pay | Admitting: Physician Assistant

## 2017-02-01 ENCOUNTER — Ambulatory Visit (INDEPENDENT_AMBULATORY_CARE_PROVIDER_SITE_OTHER): Payer: BLUE CROSS/BLUE SHIELD | Admitting: Physician Assistant

## 2017-02-01 VITALS — BP 122/68 | HR 63 | Temp 97.7°F | Resp 14 | Ht 75.5 in | Wt 259.6 lb

## 2017-02-01 DIAGNOSIS — Z79899 Other long term (current) drug therapy: Secondary | ICD-10-CM | POA: Diagnosis not present

## 2017-02-01 DIAGNOSIS — Z130 Encounter for screening for diseases of the blood and blood-forming organs and certain disorders involving the immune mechanism: Secondary | ICD-10-CM

## 2017-02-01 DIAGNOSIS — E782 Mixed hyperlipidemia: Secondary | ICD-10-CM | POA: Diagnosis not present

## 2017-02-01 DIAGNOSIS — J302 Other seasonal allergic rhinitis: Secondary | ICD-10-CM | POA: Diagnosis not present

## 2017-02-01 DIAGNOSIS — Z0001 Encounter for general adult medical examination with abnormal findings: Secondary | ICD-10-CM

## 2017-02-01 DIAGNOSIS — E349 Endocrine disorder, unspecified: Secondary | ICD-10-CM

## 2017-02-01 DIAGNOSIS — Z136 Encounter for screening for cardiovascular disorders: Secondary | ICD-10-CM

## 2017-02-01 DIAGNOSIS — I10 Essential (primary) hypertension: Secondary | ICD-10-CM | POA: Diagnosis not present

## 2017-02-01 DIAGNOSIS — R7303 Prediabetes: Secondary | ICD-10-CM

## 2017-02-01 DIAGNOSIS — R6889 Other general symptoms and signs: Secondary | ICD-10-CM

## 2017-02-01 DIAGNOSIS — K219 Gastro-esophageal reflux disease without esophagitis: Secondary | ICD-10-CM

## 2017-02-01 DIAGNOSIS — M1990 Unspecified osteoarthritis, unspecified site: Secondary | ICD-10-CM

## 2017-02-01 DIAGNOSIS — E559 Vitamin D deficiency, unspecified: Secondary | ICD-10-CM | POA: Diagnosis not present

## 2017-02-01 DIAGNOSIS — E291 Testicular hypofunction: Secondary | ICD-10-CM | POA: Diagnosis not present

## 2017-02-01 DIAGNOSIS — Z6835 Body mass index (BMI) 35.0-35.9, adult: Secondary | ICD-10-CM

## 2017-02-01 NOTE — Patient Instructions (Addendum)
Mobic is an antiinflammatory It helps pain, can not take with aleve, or ibuprofen You can take tylenol (500mg ) or tylenol arthritis (650mg ) with the meloxicam/antiinflammatories. The max you can take of tylenol a day is 3000mg  daily, this is a max of 6 pills a day of the regular tyelnol (500mg ) or a max of 4 a day of the tylenol arthritis (650mg ) as long as no other medications you are taking contain tylenol.   Mobic can cause inflammation in your stomach and can cause ulcers or bleeding, this will look like black tarry stools Make sure you take your mobic with food Try not to take it daily, take AS needed Can take with zantac  Tumeric with black pepper extract is a great natural antiinflammatory that helps with arthritis and aches and pain. Can get from [employer] or any health food store. Need to take at least 800mg  twice a day with food.   Get on allergy pill Please pick one of the over the counter allergy medications below and take it once daily for allergies.  Claritin or loratadine cheapest but likely the weakest  Zyrtec or certizine at night because it can make you sleepy The strongest is allegra or fexafinadine  Cheapest at [employer], [employer], [employer]  Get on flonase or nasonex, generic is fine Can do a steroid nasal spary 1-2 sparys at night each nostril. Remember to spray each nostril twice towards the outer part of your eye.  Do not sniff but instead pinch your nose and tilt your head back to help the medicine get into your sinuses.  The best time to do this is at bedtime. Stop if you get blurred vision or nose bleeds.   With your weight loss, cut back on your atenolol to 1/2 pill a day Monitor your BP May need to add medication but with your weight loss I'm hoping you will not be in DM range and we will not need to add medications.   Monitor your blood pressure at home. Go to the ER if any CP, SOB, nausea, dizziness, severe HA, changes vision/speech  Goal BP:  For patients younger than  60: Goal BP < 140/90. For patients 60 and older: Goal BP < 150/90. For patients with diabetes: Goal BP < 140/90. Your most recent BP: BP: 122/68   Take your medications faithfully as instructed. Maintain a healthy weight. Get at least 150 minutes of aerobic exercise per week. Minimize salt intake. Minimize alcohol intake  DASH Eating Plan DASH stands for "Dietary Approaches to Stop Hypertension." The DASH eating plan is a healthy eating plan that has been shown to reduce high blood pressure (hypertension). Additional health benefits may include reducing the risk of type 2 diabetes mellitus, heart disease, and stroke. The DASH eating plan may also help with weight loss. WHAT DO I NEED TO KNOW ABOUT THE DASH EATING PLAN? For the DASH eating plan, you will follow these general guidelines:  Choose foods with a percent daily value for sodium of less than 5% (as listed on the food label).  Use salt-free seasonings or herbs instead of table salt or sea salt.  Check with your health care provider or pharmacist before using salt substitutes.  Eat lower-sodium products, often labeled as "lower sodium" or "no salt added."  Eat fresh foods.  Eat more vegetables, fruits, and low-fat dairy products.  Choose whole grains. Look for the word "whole" as the first word in the ingredient list.  Choose fish and skinless chicken or [place] more  often than red meat. Limit fish, poultry, and meat to 6 oz (170 g) each day.  Limit sweets, desserts, sugars, and sugary drinks.  Choose heart-healthy fats.  Limit cheese to 1 oz (28 g) per day.  Eat more home-cooked food and less restaurant, buffet, and fast food.  Limit fried foods.  Cook foods using methods other than frying.  Limit canned vegetables. If you do use them, rinse them well to decrease the sodium.  When eating at a restaurant, ask that your food be prepared with less salt, or no salt if possible. WHAT FOODS CAN I EAT? Seek help from a  dietitian for individual calorie needs. Grains Whole grain or whole wheat bread. Brown rice. Whole grain or whole wheat pasta. Quinoa, bulgur, and whole grain cereals. Low-sodium cereals. Corn or whole wheat flour tortillas. Whole grain cornbread. Whole grain crackers. Low-sodium crackers. Vegetables Fresh or frozen vegetables (raw, steamed, roasted, or grilled). Low-sodium or reduced-sodium tomato and vegetable juices. Low-sodium or reduced-sodium tomato sauce and paste. Low-sodium or reduced-sodium canned vegetables.  Fruits All fresh, canned (in natural juice), or frozen fruits. Meat and Other Protein Products Ground beef (85% or leaner), grass-fed beef, or beef trimmed of fat. Skinless chicken or [place]. Ground chicken or [place]. Pork trimmed of fat. All fish and seafood. Eggs. Dried beans, peas, or lentils. Unsalted nuts and seeds. Unsalted canned beans. Dairy Low-fat dairy products, such as skim or 1% milk, 2% or reduced-fat cheeses, low-fat ricotta or cottage cheese, or plain low-fat yogurt. Low-sodium or reduced-sodium cheeses. Fats and Oils Tub margarines without trans fats. Light or reduced-fat mayonnaise and salad dressings (reduced sodium). Avocado. Safflower, olive, or canola oils. Natural peanut or almond butter. Other Unsalted popcorn and pretzels. The items listed above may not be a complete list of recommended foods or beverages. Contact your dietitian for more options. WHAT FOODS ARE NOT RECOMMENDED? Grains White bread. White pasta. White rice. Refined cornbread. Bagels and croissants. Crackers that contain trans fat. Vegetables Creamed or fried vegetables. Vegetables in a cheese sauce. Regular canned vegetables. Regular canned tomato sauce and paste. Regular tomato and vegetable juices. Fruits Dried fruits. Canned fruit in light or heavy syrup. Fruit juice. Meat and Other Protein Products Fatty cuts of meat. Ribs, chicken wings, bacon, sausage, bologna, salami,  chitterlings, fatback, hot dogs, bratwurst, and packaged luncheon meats. Salted nuts and seeds. Canned beans with salt. Dairy Whole or 2% milk, cream, half-and-half, and cream cheese. Whole-fat or sweetened yogurt. Full-fat cheeses or blue cheese. Nondairy creamers and whipped toppings. Processed cheese, cheese spreads, or cheese curds. Condiments Onion and garlic salt, seasoned salt, table salt, and sea salt. Canned and packaged gravies. Worcestershire sauce. Tartar sauce. Barbecue sauce. Teriyaki sauce. Soy sauce, including reduced sodium. Steak sauce. Fish sauce. Oyster sauce. Cocktail sauce. Horseradish. Ketchup and mustard. Meat flavorings and tenderizers. Bouillon cubes. Hot sauce. Tabasco sauce. Marinades. Taco seasonings. Relishes. Fats and Oils Butter, stick margarine, lard, shortening, ghee, and bacon fat. Coconut, palm kernel, or palm oils. Regular salad dressings. Other Pickles and olives. Salted popcorn and pretzels. The items listed above may not be a complete list of foods and beverages to avoid. Contact your dietitian for more information. WHERE CAN I FIND MORE INFORMATION? National Heart, Lung, and Blood Institute: travelstabloid.com Document Released: 04/26/2011 Document Revised: 09/21/2013 Document Reviewed: 03/11/2013 [employer] Patient Information 2015 [place], [place]. This information is not intended to replace advice given to you by your health care provider. Make sure you discuss any questions you have with your  health care provider.   Muscle aches We will check your potassium, magnesium to see if these are low which can cause muscle aches.  Please make sure you are taking 80-100 oz of water a day as long as you do now have a heart condition.   Muscle Pain Muscle pain (myalgia) may be caused by many things, including:  Overuse or muscle strain, especially if you are not in shape. This is the most common cause of muscle  pain.  Injury.  Bruises.  Viruses, such as the flu.  Infectious diseases.  Fibromyalgia, which is a chronic condition that causes muscle tenderness, fatigue, and headache.  Autoimmune diseases, including lupus.  Certain drugs, including ACE inhibitors and statins. Muscle pain may be mild or severe. In most cases, the pain lasts only a short time and goes away without treatment. To diagnose the cause of your muscle pain, your health care provider will take your medical history. This means he or she will ask you when your muscle pain began and what has been happening. If you have not had muscle pain for very long, your health care provider may want to wait before doing much testing. If your muscle pain has lasted a long time, your health care provider may want to run tests right away. If your health care provider thinks your muscle pain may be caused by illness, you may need to have additional tests to rule out certain conditions.  Treatment for muscle pain depends on the cause. Home care is often enough to relieve muscle pain. Your health care provider may also prescribe anti-inflammatory medicine. HOME CARE INSTRUCTIONS Watch your condition for any changes. The following actions may help to lessen any discomfort you are feeling:  Only take over-the-counter or prescription medicines as directed by your health care provider.  Apply ice to the sore muscle:  Put ice in a plastic bag.  Place a towel between your skin and the bag.  Leave the ice on for 15-20 minutes, 3-4 times a day.  You may alternate applying hot and cold packs to the muscle as directed by your health care provider.  If overuse is causing your muscle pain, slow down your activities until the pain goes away.  Remember that it is normal to feel some muscle pain after starting a workout program. Muscles that have not been used often will be sore at first.  Do regular, gentle exercises if you are not usually  active.  Warm up before exercising to lower your risk of muscle pain.  Do not continue working out if the pain is very bad. Bad pain could mean you have injured a muscle. SEEK MEDICAL CARE IF:  Your muscle pain gets worse, and medicines do not help.  You have muscle pain that lasts longer than 3 days.  You have a rash or fever along with muscle pain.  You have muscle pain after a tick bite.  You have muscle pain while working out, even though you are in good physical condition.  You have redness, soreness, or swelling along with muscle pain.  You have muscle pain after starting a new medicine or changing the dose of a medicine. SEEK IMMEDIATE MEDICAL CARE IF:  You have trouble breathing.  You have trouble swallowing.  You have muscle pain along with a stiff neck, fever, and vomiting.  You have severe muscle weakness or cannot move part of your body. MAKE SURE YOU:   Understand these instructions.  Will watch your condition.  Will get help right away if you are not doing well or get worse. Document Released: 03/29/2006 Document Revised: 05/12/2013 Document Reviewed: 03/03/2013 [facility] Patient Information 2015 [place], [place]. This information is not intended to replace advice given to you by your health care provider. Make sure you discuss any questions you have with your health care provider.

## 2017-02-02 LAB — BASIC METABOLIC PANEL WITH GFR
BUN: 19 mg/dL (ref 7–25)
CALCIUM: 8.7 mg/dL (ref 8.6–10.3)
CHLORIDE: 106 mmol/L (ref 98–110)
CO2: 29 mmol/L (ref 20–32)
Creat: 0.98 mg/dL (ref 0.70–1.33)
GFR, Est African American: 101 mL/min/{1.73_m2} (ref >=60)
GFR, Est Non African American: 87 mL/min/{1.73_m2} (ref >=60)
GLUCOSE: 91 mg/dL (ref 65–99)
Potassium: 4 mmol/L (ref 3.5–5.3)
Sodium: 140 mmol/L (ref 135–146)

## 2017-02-02 LAB — URINALYSIS W MICROSCOPIC + REFLEX CULTURE
Bacteria, UA: NONE SEEN /HPF
Bilirubin Urine: NEGATIVE
GLUCOSE, UA: NEGATIVE
Hgb urine dipstick: NEGATIVE
Hyaline Cast: NONE SEEN /LPF
Leukocyte Esterase: NEGATIVE
NITRITES URINE, INITIAL: NEGATIVE
PH: 6 (ref 5.0–8.0)
PROTEIN: NEGATIVE
RBC / HPF: NONE SEEN /HPF (ref 0–2)
Specific Gravity, Urine: 1.028 (ref 1.001–1.03)
Squamous Epithelial / LPF: NONE SEEN /HPF (ref <=5)
WBC, UA: NONE SEEN /HPF (ref 0–5)

## 2017-02-02 LAB — LIPID PANEL
CHOL/HDL RATIO: 2.3 (calc) (ref <5.0)
CHOLESTEROL: 144 mg/dL (ref <200)
HDL: 62 mg/dL (ref >40)
LDL CHOLESTEROL (CALC): 69 mg/dL
Non-HDL Cholesterol (Calc): 82 mg/dL (calc) (ref <130)
TRIGLYCERIDES: 47 mg/dL (ref <150)

## 2017-02-02 LAB — HEPATIC FUNCTION PANEL
AG RATIO: 1.8 (calc) (ref 1.0–2.5)
ALBUMIN MSPROF: 3.7 g/dL (ref 3.6–5.1)
ALT: 34 U/L (ref 9–46)
AST: 32 U/L (ref 10–35)
Alkaline phosphatase (APISO): 60 U/L (ref 40–115)
BILIRUBIN DIRECT: 0.2 mg/dL (ref 0.0–0.2)
GLOBULIN: 2.1 g/dL (ref 1.9–3.7)
Indirect Bilirubin: 0.6 mg/dL (calc) (ref 0.2–1.2)
Total Bilirubin: 0.8 mg/dL (ref 0.2–1.2)
Total Protein: 5.8 g/dL — ABNORMAL LOW (ref 6.1–8.1)

## 2017-02-02 LAB — CBC WITH DIFFERENTIAL/PLATELET
Basophils Absolute: 20 cells/uL (ref 0–200)
Basophils Relative: 0.4 %
Eosinophils Absolute: 170 cells/uL (ref 15–500)
Eosinophils Relative: 3.4 %
HCT: 40.4 % (ref 38.5–50.0)
Hemoglobin: 13.8 g/dL (ref 13.2–17.1)
Lymphs Abs: 1160 cells/uL (ref 850–3900)
MCH: 31.3 pg (ref 27.0–33.0)
MCHC: 34.2 g/dL (ref 32.0–36.0)
MCV: 91.6 fL (ref 80.0–100.0)
MONOS PCT: 9.2 %
MPV: 10.9 fL (ref 7.5–12.5)
Neutro Abs: 3190 cells/uL (ref 1500–7800)
Neutrophils Relative %: 63.8 %
PLATELETS: 156 10*3/uL (ref 140–400)
RBC: 4.41 10*6/uL (ref 4.20–5.80)
RDW: 12 % (ref 11.0–15.0)
Total Lymphocyte: 23.2 %
WBC: 5 10*3/uL (ref 3.8–10.8)
WBCMIX: 460 {cells}/uL (ref 200–950)

## 2017-02-02 LAB — HEMOGLOBIN A1C
HEMOGLOBIN A1C: 5.8 %{Hb} — AB (ref <5.7)
Mean Plasma Glucose: 120 (calc)
eAG (mmol/L): 6.6 (calc)

## 2017-02-02 LAB — IRON, TOTAL/TOTAL IRON BINDING CAP
%SAT: 33 % (calc) (ref 15–60)
Iron: 98 ug/dL (ref 50–180)
TIBC: 294 mcg/dL (calc) (ref 250–425)

## 2017-02-02 LAB — NO CULTURE INDICATED

## 2017-02-02 LAB — MAGNESIUM: MAGNESIUM: 2.1 mg/dL (ref 1.5–2.5)

## 2017-02-02 LAB — MICROALBUMIN / CREATININE URINE RATIO
CREATININE, URINE: 185 mg/dL (ref 20–320)
MICROALB/CREAT RATIO: 6 ug/mg{creat} (ref <30)
Microalb, Ur: 1.1 mg/dL

## 2017-02-02 LAB — VITAMIN D 25 HYDROXY (VIT D DEFICIENCY, FRACTURES): Vit D, 25-Hydroxy: 24 ng/mL — ABNORMAL LOW (ref 30–100)

## 2017-02-02 LAB — VITAMIN B12: Vitamin B-12: 317 pg/mL (ref 200–1100)

## 2017-02-02 LAB — TSH: TSH: 0.99 m[IU]/L (ref 0.40–4.50)

## 2017-02-02 LAB — TESTOSTERONE: Testosterone: 463 ng/dL (ref 250–827)

## 2017-02-04 NOTE — Progress Notes (Signed)
Pt aware of lab results & voiced understanding of those results.

## 2017-02-08 ENCOUNTER — Other Ambulatory Visit: Payer: Self-pay | Admitting: Physician Assistant

## 2017-02-08 MED ORDER — PREDNISONE 20 MG PO TABS: ORAL_TABLET | ORAL | 0 refills | Status: DC

## 2017-02-08 MED ORDER — AZITHROMYCIN 250 MG PO TABS: ORAL_TABLET | ORAL | 1 refills | Status: AC

## 2017-03-27 ENCOUNTER — Telehealth: Payer: Self-pay | Admitting: Internal Medicine

## 2017-03-27 NOTE — Telephone Encounter (Signed)
patient called with c/o right knee pain. requesting pain medicine. Per Dr Melford Aase, recommend following up with Frederik Pear, already established with this complaint. Patient agrees to contact [facility] to follow up.

## 2017-03-28 ENCOUNTER — Other Ambulatory Visit: Payer: Self-pay | Admitting: Physician Assistant

## 2017-03-28 ENCOUNTER — Telehealth: Payer: Self-pay

## 2017-03-28 MED ORDER — PREDNISONE 20 MG PO TABS: ORAL_TABLET | ORAL | 0 refills | Status: AC

## 2017-03-28 NOTE — Telephone Encounter (Signed)
Pt c/o of right knee pain & would like a Rx called into pharmacy.   Provider has sent in prednisone & pt was made aware of this as well.

## 2017-03-29 ENCOUNTER — Ambulatory Visit (INDEPENDENT_AMBULATORY_CARE_PROVIDER_SITE_OTHER): Payer: 59 | Admitting: Orthopaedic Surgery

## 2017-03-29 ENCOUNTER — Ambulatory Visit (INDEPENDENT_AMBULATORY_CARE_PROVIDER_SITE_OTHER): Payer: BLUE CROSS/BLUE SHIELD

## 2017-03-29 ENCOUNTER — Encounter (INDEPENDENT_AMBULATORY_CARE_PROVIDER_SITE_OTHER): Payer: Self-pay | Admitting: Orthopaedic Surgery

## 2017-03-29 ENCOUNTER — Ambulatory Visit (INDEPENDENT_AMBULATORY_CARE_PROVIDER_SITE_OTHER): Payer: Self-pay

## 2017-03-29 DIAGNOSIS — M1711 Unilateral primary osteoarthritis, right knee: Secondary | ICD-10-CM

## 2017-03-29 DIAGNOSIS — M1712 Unilateral primary osteoarthritis, left knee: Secondary | ICD-10-CM

## 2017-03-29 MED ORDER — CELECOXIB 200 MG PO CAPS: 200.0000 mg | ORAL_CAPSULE | Freq: Two times a day (BID) | ORAL | 3 refills | Status: DC

## 2017-03-29 MED ORDER — DICLOFENAC SODIUM 1 % TD GEL: 2.0000 g | Freq: Four times a day (QID) | TRANSDERMAL | 5 refills | Status: DC

## 2017-03-29 MED ORDER — TRAMADOL HCL 50 MG PO TABS: 50.0000 mg | ORAL_TABLET | Freq: Three times a day (TID) | ORAL | 2 refills | Status: DC | PRN

## 2017-03-29 NOTE — Progress Notes (Signed)
Office Visit Note   Patient: Dale Tapia           Date of Birth: 1960/08/01           MRN: [id number] Visit Date: 03/29/2017              Requested by: Unk Pinto, [address] [place], [place] [address] PCP: Unk Pinto, MD   Assessment & Plan: Visit Diagnoses:  1. Primary osteoarthritis of right knee   2. Unilateral primary osteoarthritis, left knee     Plan: Impression is bilateral knee osteoarthritis worst in the medial compartment.  Patient has exquisite pain on the medial side of the knee.  We will get him set up for medial unloader brace with [employer].  Prescription for Voltaren, Celebrex, tramadol just at night.  Questions encouraged and answered.  Follow-up as needed.  Follow-Up Instructions: Return if symptoms worsen or fail to improve.   Orders:  Orders Placed This Encounter  Procedures  . XR KNEE 3 VIEW RIGHT  . XR KNEE 3 VIEW LEFT   Meds ordered this encounter  Medications  . diclofenac sodium (VOLTAREN) 1 % GEL    Sig: Apply 2 g 4 (four) times daily topically.    Dispense:  1 Tube    Refill:  5  . traMADol (ULTRAM) 50 MG tablet    Sig: Take 1-2 tablets (50-100 mg total) 3 (three) times daily as needed by mouth.    Dispense:  30 tablet    Refill:  2  . celecoxib (CELEBREX) 200 MG capsule    Sig: Take 1 capsule (200 mg total) 2 (two) times daily by mouth.    Dispense:  30 capsule    Refill:  3      Procedures: No procedures performed   Clinical Data: No additional findings.   Subjective: Chief Complaint  Patient presents with  . Left Knee - Pain  . Right Knee - Pain    Dale Tapia comes in today for follow-up of bilateral knee pain that is worse on the right.  He previously had a left knee injection in August with partial relief.  He does heavy labor as an occupation.      Review of Systems   Objective: Vital Signs: There were no vitals taken for this visit.  Physical Exam  Ortho Exam Bilateral knee  exam shows no joint effusion.  Mild varus alignment.  Collaterals and cruciates are stable.  Medial joint line tenderness Specialty Comments:  No specialty comments available.  Imaging: Xr Knee 3 View Left  Result Date: 03/29/2017 Advanced degenerative joint disease worst in the medial compartment  Xr Knee 3 View Right  Result Date: 03/29/2017 Advanced degenerative joint disease worst in the medial compartment    PMFS History: Patient Active Problem List   Diagnosis Date Noted  . Obesity 05/03/2014  . Prediabetes   . Hypertension   . Arthritis   . Allergic rhinitis   . Hyperlipidemia    Past Medical History:  Diagnosis Date  . Allergic rhinitis, cause unspecified   . Arthritis    neck and knees  . Hyperlipidemia   . Hypertension   . Prediabetes     Family History  Problem Relation Age of Onset  . Colon cancer Neg Hx   . Hypertension Mother   . Arthritis Mother   . Sleep apnea Mother   . Hypertension Father   . Kidney disease Father     History reviewed. No pertinent  surgical history. Social History   Occupational History  . Not on file  Tobacco Use  . Smoking status: Never Smoker  . Smokeless tobacco: Never Used  Substance and Sexual Activity  . Alcohol use: No  . Drug use: No  . Sexual activity: Not on file

## 2017-04-03 ENCOUNTER — Encounter (INDEPENDENT_AMBULATORY_CARE_PROVIDER_SITE_OTHER): Payer: Self-pay | Admitting: Orthopaedic Surgery

## 2017-04-22 ENCOUNTER — Ambulatory Visit (INDEPENDENT_AMBULATORY_CARE_PROVIDER_SITE_OTHER): Payer: BLUE CROSS/BLUE SHIELD | Admitting: Physician Assistant

## 2017-04-22 ENCOUNTER — Encounter: Payer: Self-pay | Admitting: Physician Assistant

## 2017-04-22 VITALS — BP 142/92 | HR 81 | Temp 97.7°F | Resp 16 | Ht 75.5 in | Wt 267.2 lb

## 2017-04-22 DIAGNOSIS — J069 Acute upper respiratory infection, unspecified: Secondary | ICD-10-CM

## 2017-04-22 MED ORDER — BENZONATATE 100 MG PO CAPS: 100.0000 mg | ORAL_CAPSULE | Freq: Three times a day (TID) | ORAL | 0 refills | Status: DC | PRN

## 2017-04-22 MED ORDER — PREDNISONE 20 MG PO TABS: ORAL_TABLET | ORAL | 0 refills | Status: DC

## 2017-04-22 MED ORDER — AZITHROMYCIN 250 MG PO TABS: ORAL_TABLET | ORAL | 1 refills | Status: DC

## 2017-04-22 NOTE — Patient Instructions (Signed)
Stop celebrex while on prednisone, can restart afterwards  Make sure you are on an allergy pill, see below for more details. Please take the prednisone as directed below, this is NOT an antibiotic so you do NOT have to finish it. You can take it for a few days and stop it if you are doing better.   Please take the prednisone to help decrease inflammation and therefore decrease symptoms. Take it it with food to avoid GI upset. It can cause increased energy but on the other hand it can make it hard to sleep at night so please take it AT [place], it takes 8-12 hours to start working so it will NOT affect your sleeping if you take it at night with your food!!  If you are diabetic it will increase your sugars so decrease carbs and monitor your sugars closely.      HOW TO TREAT VIRAL COUGH AND COLD SYMPTOMS:  -Symptoms usually last at least 1 week with the worst symptoms being around day 4.  - colds usually start with a sore throat and end with a cough, and the cough can take 2 weeks to get better.  -No antibiotics are needed for colds, flu, sore throats, cough, bronchitis UNLESS symptoms are longer than 7 days OR if you are getting better then get drastically worse.  -There are a lot of combination medications (Dayquil, Nyquil, Vicks 44, tyelnol cold and sinus, ETC). Please look at the ingredients on the back so that you are treating the correct symptoms and not doubling up on medications/ingredients.    Medicines you can use  Nasal congestion  - pseudoephedrine (Sudafed)- behind the counter, do not use if you have high blood pressure, medicine that have -D in them.  - phenylephrine (Sudafed PE) -Dextormethorphan + chlorpheniramine (Coridcidin HBP)- okay if you have high blood pressure -Oxymetazoline (Afrin) nasal spray- LIMIT to 3 days -Saline nasal spray -Neti pot (used distilled or bottled water)  Ear pain/congestion  -pseudoephedrine (sudafed) - Nasonex/flonase nasal  spray  Fever  -Acetaminophen (Tyelnol) -Ibuprofen (Advil, motrin, aleve)  Sore Throat  -Acetaminophen (Tyelnol) -Ibuprofen (Advil, motrin, aleve) -Drink a lot of water -Gargle with salt water - Rest your voice (don't talk) -Throat sprays -Cough drops  Body Aches  -Acetaminophen (Tyelnol) -Ibuprofen (Advil, motrin, aleve)  Headache  -Acetaminophen (Tyelnol) -Ibuprofen (Advil, motrin, aleve) - Exedrin, Exedrin Migraine  Allergy symptoms (cough, sneeze, runny nose, itchy eyes) -Claritin or loratadine cheapest but likely the weakest  -Zyrtec or certizine at night because it can make you sleepy -The strongest is allegra or fexafinadine  Cheapest at [employer], [employer], [employer]  Cough  -Dextromethorphan (Delsym)- medicine that has DM in it -Guafenesin (Mucinex/Robitussin) - cough drops - drink lots of water  Chest Congestion  -Guafenesin (Mucinex/Robitussin)  Red Itchy Eyes  - Naphcon-A  Upset Stomach  - Bland diet (nothing spicy, greasy, fried, and high acid foods like tomatoes, oranges, berries) -OKAY- cereal, bread, soup, crackers, rice -Eat smaller more frequent meals -reduce caffeine, no alcohol -Loperamide (Imodium-AD) if diarrhea -Prevacid for heart burn  General health when sick  -Hydration -wash your hands frequently -keep surfaces clean -change pillow cases and sheets often -Get fresh air but do not exercise strenuously -Vitamin D, double up on it - Vitamin C -Zinc

## 2017-04-22 NOTE — Progress Notes (Signed)
Subjective:    Patient ID: Dale Tapia, male    DOB: 1960/08/14, 56 y.o.   MRN: [id number]  HPI 56 y.o. nonsmoking obese WM presents for URI x 3 weeks.  States it is improved but not going away. Has sinus congestion, ear fullness, cough with yellow mucus. Taking cough syrup RX at night. He has albuterol, but no history of asthma, not on allergy pill, on all natural pill. On sudafed and mucinex.   Blood pressure (!) 142/92, pulse 81, temperature 97.7 F (36.5 C), resp. rate 16, height 6' 3.5" (1.918 m), weight 267 lb 3.2 oz (121.2 kg), SpO2 97 %.  Medications Current Outpatient Medications on File Prior to Visit  Medication Sig  . albuterol (VENTOLIN HFA) 108 (90 Base) MCG/ACT inhaler Inhale 2 puffs into the lungs every 4 (four) hours as needed for wheezing or shortness of breath.  Marland Kitchen atenolol (TENORMIN) 100 MG tablet Take 1 tablet (100 mg total) by mouth daily.  . celecoxib (CELEBREX) 200 MG capsule Take 1 capsule (200 mg total) 2 (two) times daily by mouth.  . diclofenac sodium (VOLTAREN) 1 % GEL Apply 2 g 4 (four) times daily topically.  Marland Kitchen etodolac (LODINE) 500 MG tablet Take 500 mg by mouth 2 (two) times daily.  . meloxicam (MOBIC) 15 MG tablet Take 1 tablet (15 mg total) by mouth daily.  . Multiple Vitamins-Minerals (MULTIVITAMIN WITH MINERALS) tablet Take 1 tablet by mouth daily.  . traMADol (ULTRAM) 50 MG tablet Take 2 tablets (100 mg total) by mouth every 6 (six) hours as needed.  Marland Kitchen azelastine (ASTELIN) 0.1 % nasal spray Place 2 sprays into both nostrils 2 (two) times daily. Use in each nostril as directed   No current facility-administered medications on file prior to visit.     Problem list He has Hypertension; Arthritis; Allergic rhinitis; Hyperlipidemia; Prediabetes; and Obesity on their problem list.   Review of Systems  Constitutional: Negative for chills, diaphoresis and fever.  HENT: Positive for congestion, sinus pressure, sneezing and sore throat. Negative for  ear pain, trouble swallowing and voice change.   Eyes: Negative.   Respiratory: Positive for cough. Negative for chest tightness, shortness of breath and wheezing.   Cardiovascular: Negative.   Gastrointestinal: Negative.   Genitourinary: Negative.   Musculoskeletal: Negative for neck pain.  Neurological: Negative.  Negative for headaches.       Objective:   Physical Exam  Constitutional: He is oriented to person, place, and time. He appears well-developed and well-nourished.  HENT:  Head: Normocephalic and atraumatic.  Right Ear: Hearing and tympanic membrane normal.  Left Ear: Hearing and tympanic membrane normal.  Nose: Right sinus exhibits maxillary sinus tenderness. Left sinus exhibits maxillary sinus tenderness.  Mouth/Throat: Uvula is midline and mucous membranes are normal. Posterior oropharyngeal erythema present. No oropharyngeal exudate, posterior oropharyngeal edema or tonsillar abscesses.  Eyes: Conjunctivae are normal. Pupils are equal, round, and reactive to light.  Neck: Normal range of motion. Neck supple.  Cardiovascular: Normal rate and regular rhythm.  Pulmonary/Chest: Effort normal and breath sounds normal.  Abdominal: Soft. Bowel sounds are normal.  Musculoskeletal: Normal range of motion.  Lymphadenopathy:    He has no cervical adenopathy.  Neurological: He is alert and oriented to person, place, and time.  Skin: Skin is warm and dry. No rash noted.      Assessment & Plan:   Acute URI -     predniSONE (DELTASONE) 20 MG tablet; 2 tablets daily for 3 days, 1 tablet  daily for 4 days. -     azithromycin (ZITHROMAX) 250 MG tablet; Take 2 tablets (500 mg) on  Day 1,  followed by 1 tablet (250 mg) once daily on Days 2 through 5. -     benzonatate (TESSALON PERLES) 100 MG capsule; Take 1 capsule (100 mg total) by mouth 3 (three) times daily as needed for cough (Max: 600mg  per day). Will hold the zpak and take if she is not getting better, increase fluids, rest,  cont allergy pill  The patient was advised to call immediately if he has any concerning symptoms in the interval. The patient voices understanding of current treatment options and is in agreement with the current care plan.The patient knows to call the clinic with any problems, questions or concerns or go to the ER if any further progression of symptoms.

## 2017-06-19 ENCOUNTER — Other Ambulatory Visit (INDEPENDENT_AMBULATORY_CARE_PROVIDER_SITE_OTHER): Payer: Self-pay | Admitting: Orthopaedic Surgery

## 2017-06-20 NOTE — Telephone Encounter (Signed)
Called Rx into pharm

## 2017-06-20 NOTE — Telephone Encounter (Signed)
Ok to do

## 2017-06-21 NOTE — Telephone Encounter (Signed)
thanks

## 2017-07-09 ENCOUNTER — Other Ambulatory Visit: Payer: Self-pay

## 2017-07-09 MED ORDER — ATENOLOL 100 MG PO TABS: 100.0000 mg | ORAL_TABLET | Freq: Every day | ORAL | 3 refills | Status: DC

## 2017-07-10 ENCOUNTER — Encounter (INDEPENDENT_AMBULATORY_CARE_PROVIDER_SITE_OTHER): Payer: Self-pay | Admitting: Orthopaedic Surgery

## 2017-07-10 ENCOUNTER — Ambulatory Visit (INDEPENDENT_AMBULATORY_CARE_PROVIDER_SITE_OTHER): Payer: BLUE CROSS/BLUE SHIELD | Admitting: Physician Assistant

## 2017-07-10 DIAGNOSIS — M170 Bilateral primary osteoarthritis of knee: Secondary | ICD-10-CM

## 2017-07-10 MED ORDER — METHYLPREDNISOLONE ACETATE 40 MG/ML IJ SUSP
40.0000 mg | INTRAMUSCULAR | Status: AC | PRN
  Administered 2017-07-10: 40 mg via INTRA_ARTICULAR

## 2017-07-10 MED ORDER — LIDOCAINE HCL 1 % IJ SOLN
0.5000 mL | INTRAMUSCULAR | Status: AC | PRN
  Administered 2017-07-10: .5 mL

## 2017-07-10 NOTE — Progress Notes (Signed)
   Procedure Note  Patient: Dale Tapia             Date of Birth: [birth year]           MRN: [id number]             Visit Date: 07/10/2017 HPI: Dale Tapia is a 57 year old male who comes in today with complaint of bilateral knee pain.  He has known bilateral knee osteoarthritis.  He denies any catching locking giving way of either knee.  He states he has throbbing pain in both knees.  Takes Celebrex and tramadol for pain.  He is nondiabetic.  Review of systems negative for fevers chills chest pain.  Has chronic shortness of breath.  Bilateral knees: No effusion or erythema.  Overall good range of motion bilateral knees. Procedures: Visit Diagnoses: Primary osteoarthritis of both knees  Large Joint Inj: bilateral knee on 07/10/2017 8:45 AM Indications: pain Details: 22 G 1.5 in needle, anterolateral approach  Arthrogram: No  Medications (Right): 0.5 mL lidocaine 1 %; 40 mg methylPREDNISolone acetate 40 MG/ML Medications (Left): 0.5 mL lidocaine 1 %; 40 mg methylPREDNISolone acetate 40 MG/ML Outcome: tolerated well, no immediate complications Procedure, treatment alternatives, risks and benefits explained, specific risks discussed. Consent was given by the patient. Immediately prior to procedure a time out was called to verify the correct patient, procedure, equipment, support staff and site/side marked as required. Patient was prepped and draped in the usual sterile fashion.    Plan: We will refill his tramadol and Celebrex.  He will follow-up with [place] on an as-needed basis.  He understands that he can only have injection as frequent as every 3 months.

## 2017-08-09 ENCOUNTER — Ambulatory Visit (INDEPENDENT_AMBULATORY_CARE_PROVIDER_SITE_OTHER): Payer: BLUE CROSS/BLUE SHIELD | Admitting: Physician Assistant

## 2017-08-09 ENCOUNTER — Encounter: Payer: Self-pay | Admitting: Physician Assistant

## 2017-08-09 VITALS — BP 142/98 | HR 60 | Resp 16 | Ht 75.5 in | Wt 272.8 lb

## 2017-08-09 DIAGNOSIS — R7303 Prediabetes: Secondary | ICD-10-CM

## 2017-08-09 DIAGNOSIS — Z79899 Other long term (current) drug therapy: Secondary | ICD-10-CM

## 2017-08-09 DIAGNOSIS — E782 Mixed hyperlipidemia: Secondary | ICD-10-CM | POA: Diagnosis not present

## 2017-08-09 DIAGNOSIS — E559 Vitamin D deficiency, unspecified: Secondary | ICD-10-CM | POA: Diagnosis not present

## 2017-08-09 DIAGNOSIS — I10 Essential (primary) hypertension: Secondary | ICD-10-CM | POA: Diagnosis not present

## 2017-08-09 MED ORDER — TELMISARTAN 40 MG PO TABS: 40.0000 mg | ORAL_TABLET | Freq: Every day | ORAL | 0 refills | Status: DC

## 2017-08-09 NOTE — Progress Notes (Signed)
Assessment and Plan:  Hypertension:  Since cut atenolol in half, add on micardis 40 mg in AM  -Continue medication,  -monitor blood pressure at home.  -Continue DASH diet.   -Reminder to go to the ER if any CP, SOB, nausea, dizziness, severe HA, changes vision/speech, left arm numbness and tingling, and jaw pain.  Cholesterol: -Continue diet and exercise.  -Check cholesterol.   Pre-diabetes: -Continue diet and exercise.  -Check A1C  Vitamin D Def: -continue medications.   Morbid obesity  -keep calorie log -diet and exercise  Myalgias -take NSAIDS PRN, increase fluids, will check  magnesium, potassium, denies recent tick exposure. ? BENEFIT from requip   Continue diet and meds as discussed. Further disposition pending results of labs. Future Appointments  Date Time Provider [place]  02/28/2018  9:30 AM Vicie Mutters, PA-C [id number] None    HPI 57 y.o. male  presents for 6 month follow up with hypertension, hyperlipidemia, prediabetes and vitamin D.   His blood pressure has NOT been controlled at home, last visit his atenolol was cut in half due to bradycardia, today their BP is BP: (!) 142/98.   He does not workout due to working a lot and left knee pain. He denies chest pain, shortness of breath, dizziness.   He has leg cramps all night, better with movement, has had for years, worse during the summer.    He is on cholesterol medication and denies myalgias. His cholesterol is at goal. The cholesterol last visit was:   Lab Results  Component Value Date   CHOL 144 02/01/2017   HDL 62 02/01/2017   LDLCALC 69 02/01/2017   TRIG 47 02/01/2017   CHOLHDL 2.3 02/01/2017    He has been working on diet and exercise for prediabetes, and denies foot ulcerations, hyperglycemia, hypoglycemia , increased appetite, nausea, paresthesia of the feet, polydipsia, polyuria, visual disturbances, vomiting and weight loss. Last A1C in the office was:  Lab Results  Component  Value Date   HGBA1C 5.8 (H) 02/01/2017   Patient is on Vitamin D supplement, 5000 IU.  Lab Results  Component Value Date   VD25OH 24 (L) 02/01/2017     BMI is Body mass index is 33.65 kg/m., he is working on diet and exercise. He has bilateral cortisone shots again for his knees that have helped, otherwise takes Celebrex and occ tramadol. Wt Readings from Last 3 Encounters:  08/09/17 272 lb 12.8 oz (123.7 kg)  04/22/17 267 lb 3.2 oz (121.2 kg)  02/01/17 259 lb 9.6 oz (117.8 kg)    Current Medications:  Current Outpatient Medications on File Prior to Visit  Medication Sig Dispense Refill  . albuterol (VENTOLIN HFA) 108 (90 Base) MCG/ACT inhaler Inhale 2 puffs into the lungs every 4 (four) hours as needed for wheezing or shortness of breath. 1 Inhaler 2  . atenolol (TENORMIN) 100 MG tablet Take 1 tablet (100 mg total) by mouth daily. 30 tablet 3  . celecoxib (CELEBREX) 200 MG capsule Take 1 capsule (200 mg total) 2 (two) times daily by mouth. 30 capsule 3  . diclofenac sodium (VOLTAREN) 1 % GEL Apply 2 g 4 (four) times daily topically. 1 Tube 5  . etodolac (LODINE) 500 MG tablet Take 500 mg by mouth 2 (two) times daily.    . meloxicam (MOBIC) 15 MG tablet Take 1 tablet (15 mg total) by mouth daily. 90 tablet 1  . Multiple Vitamins-Minerals (MULTIVITAMIN WITH MINERALS) tablet Take 1 tablet by mouth daily.    Marland Kitchen  traMADol (ULTRAM) 50 MG tablet Take 2 tablets (100 mg total) by mouth every 6 (six) hours as needed. 120 tablet 0  . azelastine (ASTELIN) 0.1 % nasal spray Place 2 sprays into both nostrils 2 (two) times daily. Use in each nostril as directed 30 mL 2   No current facility-administered medications on file prior to visit.     Medical History:  Past Medical History:  Diagnosis Date  . Allergic rhinitis, cause unspecified   . Arthritis    neck and knees  . Hyperlipidemia   . Hypertension   . Prediabetes     Allergies: No Known Allergies   Review of Systems:  Review of  Systems  Constitutional: Negative for chills, fever and malaise/fatigue.  HENT: Negative for congestion, ear pain and sore throat.   Eyes: Negative.   Respiratory: Negative for cough, shortness of breath and wheezing.   Cardiovascular: Negative for chest pain, palpitations and leg swelling.  Gastrointestinal: Negative for abdominal pain, blood in stool, constipation, diarrhea, heartburn and melena.  Genitourinary: Negative.   Skin: Negative.   Neurological: Negative for dizziness, sensory change, loss of consciousness and headaches.  Psychiatric/Behavioral: Negative for depression. The patient is not nervous/anxious and does not have insomnia.     Family history- Review and unchanged  Social history- Review and unchanged  Physical Exam: BP (!) 142/98   Pulse 60   Resp 16   Ht 6' 3.5" (1.918 m)   Wt 272 lb 12.8 oz (123.7 kg)   SpO2 97%   BMI 33.65 kg/m  Wt Readings from Last 3 Encounters:  08/09/17 272 lb 12.8 oz (123.7 kg)  04/22/17 267 lb 3.2 oz (121.2 kg)  02/01/17 259 lb 9.6 oz (117.8 kg)   General Appearance: Well nourished well developed, in no apparent distress. Eyes: PERRLA, EOMs, conjunctiva no swelling or erythema ENT/Mouth: Ear canals normal without obstruction, swelling, erythma, discharge.  TMs normal bilaterally.  Oropharynx moist, clear, without exudate, or postoropharyngeal swelling. Neck: Supple, thyroid normal,no cervical adenopathy  Respiratory: Respiratory effort normal, Breath sounds clear A&P without rhonchi, wheeze, or rale.  No retractions, no accessory usage. Cardio: RRR with no MRGs. Brisk peripheral pulses without edema.  Abdomen: Soft, + BS,  Non tender, no guarding, rebound, hernias, masses. Musculoskeletal: Full ROM, 5/5 strength, Normal gait Skin: Warm, dry without rashes, lesions, ecchymosis.  Neuro: Awake and oriented X 3, Cranial nerves intact. Normal muscle tone, no cerebellar symptoms. Psych: Normal affect, Insight and Judgment appropriate.     Vicie Mutters, PA-C 8:51 AM [facility]

## 2017-08-09 NOTE — Patient Instructions (Signed)
Magnesium add 500 mg-1000mg  with food to prevent diarrhea. Magnesium may help with muscle cramps, constipation, vitamin D and potassium absorption.   Ways to prevent diarrhea with magnesium:  1) Don't take all your magnesium at the same time, have 2-3 smaller doses through out the day 2) Try taking your magnesium with high fiber meals.  3) If this does not help, take the magnesium on an empty stomach. Fiber for some people can bind the magnesium too well and prevent absorption in your gut.  4) Lastly try different types of magnesium. Most people are taking magnesium citrate, you can also try dimalate capsules which are slow release. You can also find magnesium lotions/sprays for the skin that bypass the gut. Another one that has good absorption is ReMag (pico-iconic magnesium formula), this has great cellular absorption so less of a laxative effect. You can find these type at health food stores or online.    We are going to add micardis 40mg  daily in the morning, continue the atenolol 1/2 pill at night.   Monitor your blood pressure at home, please keep a record and bring that in with you to your next office visit.   Go to the ER if any CP, SOB, nausea, dizziness, severe HA, changes vision/speech  Due to a recent study, SPRINT, we have changed our goal for the systolic or top blood pressure number. Ideally we want your top number at 120.  In the [facility] Trial, 5000 people were randomized to a goal BP of 120 and 5000 people were randomized to a goal BP of less than 140. The patients with the goal BP at 120 had LESS DEMENTIA, LESS HEART ATTACKS, AND LESS STROKES, AS WELL AS OVERALL DECREASED MORTALITY OR DEATH RATE.   If you are willing, our goal BP is the top number of 120.  Your most recent BP: BP: (!) 142/98   Take your medications faithfully as instructed. Maintain a healthy weight. Get at least 150 minutes of aerobic exercise per week. Minimize salt intake. Minimize alcohol  intake  DASH Eating Plan DASH stands for "Dietary Approaches to Stop Hypertension." The DASH eating plan is a healthy eating plan that has been shown to reduce high blood pressure (hypertension). Additional health benefits may include reducing the risk of type 2 diabetes mellitus, heart disease, and stroke. The DASH eating plan may also help with weight loss. WHAT DO I NEED TO KNOW ABOUT THE DASH EATING PLAN? For the DASH eating plan, you will follow these general guidelines:  Choose foods with a percent daily value for sodium of less than 5% (as listed on the food label).  Use salt-free seasonings or herbs instead of table salt or sea salt.  Check with your health care provider or pharmacist before using salt substitutes.  Eat lower-sodium products, often labeled as "lower sodium" or "no salt added."  Eat fresh foods.  Eat more vegetables, fruits, and low-fat dairy products.  Choose whole grains. Look for the word "whole" as the first word in the ingredient list.  Choose fish and skinless chicken or [place] more often than red meat. Limit fish, poultry, and meat to 6 oz (170 g) each day.  Limit sweets, desserts, sugars, and sugary drinks.  Choose heart-healthy fats.  Limit cheese to 1 oz (28 g) per day.  Eat more home-cooked food and less restaurant, buffet, and fast food.  Limit fried foods.  Cook foods using methods other than frying.  Limit canned vegetables. If you do use them,  rinse them well to decrease the sodium.  When eating at a restaurant, ask that your food be prepared with less salt, or no salt if possible. WHAT FOODS CAN I EAT? Seek help from a dietitian for individual calorie needs. Grains Whole grain or whole wheat bread. Brown rice. Whole grain or whole wheat pasta. Quinoa, bulgur, and whole grain cereals. Low-sodium cereals. Corn or whole wheat flour tortillas. Whole grain cornbread. Whole grain crackers. Low-sodium crackers. Vegetables Fresh or frozen  vegetables (raw, steamed, roasted, or grilled). Low-sodium or reduced-sodium tomato and vegetable juices. Low-sodium or reduced-sodium tomato sauce and paste. Low-sodium or reduced-sodium canned vegetables.  Fruits All fresh, canned (in natural juice), or frozen fruits. Meat and Other Protein Products Ground beef (85% or leaner), grass-fed beef, or beef trimmed of fat. Skinless chicken or [place]. Ground chicken or [place]. Pork trimmed of fat. All fish and seafood. Eggs. Dried beans, peas, or lentils. Unsalted nuts and seeds. Unsalted canned beans. Dairy Low-fat dairy products, such as skim or 1% milk, 2% or reduced-fat cheeses, low-fat ricotta or cottage cheese, or plain low-fat yogurt. Low-sodium or reduced-sodium cheeses. Fats and Oils Tub margarines without trans fats. Light or reduced-fat mayonnaise and salad dressings (reduced sodium). Avocado. Safflower, olive, or canola oils. Natural peanut or almond butter. Other Unsalted popcorn and pretzels. The items listed above may not be a complete list of recommended foods or beverages. Contact your dietitian for more options. WHAT FOODS ARE NOT RECOMMENDED? Grains White bread. White pasta. White rice. Refined cornbread. Bagels and croissants. Crackers that contain trans fat. Vegetables Creamed or fried vegetables. Vegetables in a cheese sauce. Regular canned vegetables. Regular canned tomato sauce and paste. Regular tomato and vegetable juices. Fruits Dried fruits. Canned fruit in light or heavy syrup. Fruit juice. Meat and Other Protein Products Fatty cuts of meat. Ribs, chicken wings, bacon, sausage, bologna, salami, chitterlings, fatback, hot dogs, bratwurst, and packaged luncheon meats. Salted nuts and seeds. Canned beans with salt. Dairy Whole or 2% milk, cream, half-and-half, and cream cheese. Whole-fat or sweetened yogurt. Full-fat cheeses or blue cheese. Nondairy creamers and whipped toppings. Processed cheese, cheese spreads, or cheese  curds. Condiments Onion and garlic salt, seasoned salt, table salt, and sea salt. Canned and packaged gravies. Worcestershire sauce. Tartar sauce. Barbecue sauce. Teriyaki sauce. Soy sauce, including reduced sodium. Steak sauce. Fish sauce. Oyster sauce. Cocktail sauce. Horseradish. Ketchup and mustard. Meat flavorings and tenderizers. Bouillon cubes. Hot sauce. Tabasco sauce. Marinades. Taco seasonings. Relishes. Fats and Oils Butter, stick margarine, lard, shortening, ghee, and bacon fat. Coconut, palm kernel, or palm oils. Regular salad dressings. Other Pickles and olives. Salted popcorn and pretzels. The items listed above may not be a complete list of foods and beverages to avoid. Contact your dietitian for more information. WHERE CAN I FIND MORE INFORMATION? National Heart, Lung, and Blood Institute: travelstabloid.com Document Released: 04/26/2011 Document Revised: 09/21/2013 Document Reviewed: 03/11/2013 [facility] Patient Information 2015 [place], [place]. This information is not intended to replace advice given to you by your health care provider. Make sure you discuss any questions you have with your health care provider.  Muscle aches We will check your potassium, magnesium to see if these are low which can cause muscle aches.  Please make sure you are taking 64 oz of water a day as long as you do now have a heart condition.  -Please take Tylenol or Aleve for pain. -You can take tylenol (500mg ) or tylenol arthritis (650mg ). The max you can take of tylenol a  day is 3000mg  daily, this is a max of 6 pills a day of the regular tyelnol (500mg ) or a max of 4 a day of the tylenol arthritis (650mg ) as long as no other medications you are taking contain tylenol.  -Aleve/Naproxen Sodium- can take 1-2 in the morning and 1 at night. Max 3 a day. Take with food to avoid ulcers. Does affect your kidney function so use sparingly.   Muscle Pain Muscle pain (myalgia) may  be caused by many things, including:  Overuse or muscle strain, especially if you are not in shape. This is the most common cause of muscle pain.  Injury.  Bruises.  Viruses, such as the flu.  Infectious diseases.  Fibromyalgia, which is a chronic condition that causes muscle tenderness, fatigue, and headache.  Autoimmune diseases, including lupus.  Certain drugs, including ACE inhibitors and statins. Muscle pain may be mild or severe. In most cases, the pain lasts only a short time and goes away without treatment. To diagnose the cause of your muscle pain, your health care provider will take your medical history. This means he or she will ask you when your muscle pain began and what has been happening. If you have not had muscle pain for very long, your health care provider may want to wait before doing much testing. If your muscle pain has lasted a long time, your health care provider may want to run tests right away. If your health care provider thinks your muscle pain may be caused by illness, you may need to have additional tests to rule out certain conditions.  Treatment for muscle pain depends on the cause. Home care is often enough to relieve muscle pain. Your health care provider may also prescribe anti-inflammatory medicine. HOME CARE INSTRUCTIONS Watch your condition for any changes. The following actions may help to lessen any discomfort you are feeling:  Only take over-the-counter or prescription medicines as directed by your health care provider.  Apply ice to the sore muscle:  Put ice in a plastic bag.  Place a towel between your skin and the bag.  Leave the ice on for 15-20 minutes, 3-4 times a day.  You may alternate applying hot and cold packs to the muscle as directed by your health care provider.  If overuse is causing your muscle pain, slow down your activities until the pain goes away.  Remember that it is normal to feel some muscle pain after starting a  workout program. Muscles that have not been used often will be sore at first.  Do regular, gentle exercises if you are not usually active.  Warm up before exercising to lower your risk of muscle pain.  Do not continue working out if the pain is very bad. Bad pain could mean you have injured a muscle. SEEK MEDICAL CARE IF:  Your muscle pain gets worse, and medicines do not help.  You have muscle pain that lasts longer than 3 days.  You have a rash or fever along with muscle pain.  You have muscle pain after a tick bite.  You have muscle pain while working out, even though you are in good physical condition.  You have redness, soreness, or swelling along with muscle pain.  You have muscle pain after starting a new medicine or changing the dose of a medicine. SEEK IMMEDIATE MEDICAL CARE IF:  You have trouble breathing.  You have trouble swallowing.  You have muscle pain along with a stiff neck, fever, and vomiting.  You  have severe muscle weakness or cannot move part of your body. MAKE SURE YOU:   Understand these instructions.  Will watch your condition.  Will get help right away if you are not doing well or get worse. Document Released: 03/29/2006 Document Revised: 05/12/2013 Document Reviewed: 03/03/2013 [facility] Patient Information 2015 [place], [place]. This information is not intended to replace advice given to you by your health care provider. Make sure you discuss any questions you have with your health care provider.

## 2017-08-10 LAB — HEMOGLOBIN A1C
EAG (MMOL/L): 7.1 (calc)
Hgb A1c MFr Bld: 6.1 % of total Hgb — ABNORMAL HIGH (ref <5.7)
MEAN PLASMA GLUCOSE: 128 (calc)

## 2017-08-10 LAB — CBC WITH DIFFERENTIAL/PLATELET
BASOS ABS: 42 {cells}/uL (ref 0–200)
Basophils Relative: 0.8 %
EOS ABS: 154 {cells}/uL (ref 15–500)
Eosinophils Relative: 2.9 %
HCT: 41.3 % (ref 38.5–50.0)
Hemoglobin: 14.7 g/dL (ref 13.2–17.1)
Lymphs Abs: 1261 cells/uL (ref 850–3900)
MCH: 32 pg (ref 27.0–33.0)
MCHC: 35.6 g/dL (ref 32.0–36.0)
MCV: 89.8 fL (ref 80.0–100.0)
MONOS PCT: 11.2 %
MPV: 11.2 fL (ref 7.5–12.5)
Neutro Abs: 3249 cells/uL (ref 1500–7800)
Neutrophils Relative %: 61.3 %
PLATELETS: 151 10*3/uL (ref 140–400)
RBC: 4.6 10*6/uL (ref 4.20–5.80)
RDW: 12.5 % (ref 11.0–15.0)
Total Lymphocyte: 23.8 %
WBC mixed population: 594 cells/uL (ref 200–950)
WBC: 5.3 10*3/uL (ref 3.8–10.8)

## 2017-08-10 LAB — HEPATIC FUNCTION PANEL
AG RATIO: 1.9 (calc) (ref 1.0–2.5)
ALKALINE PHOSPHATASE (APISO): 75 U/L (ref 40–115)
ALT: 24 U/L (ref 9–46)
AST: 28 U/L (ref 10–35)
Albumin: 4.1 g/dL (ref 3.6–5.1)
BILIRUBIN INDIRECT: 0.7 mg/dL (ref 0.2–1.2)
Bilirubin, Direct: 0.2 mg/dL (ref 0.0–0.2)
Globulin: 2.2 g/dL (calc) (ref 1.9–3.7)
TOTAL PROTEIN: 6.3 g/dL (ref 6.1–8.1)
Total Bilirubin: 0.9 mg/dL (ref 0.2–1.2)

## 2017-08-10 LAB — LIPID PANEL
CHOL/HDL RATIO: 2.9 (calc) (ref <5.0)
CHOLESTEROL: 182 mg/dL (ref <200)
HDL: 63 mg/dL (ref >40)
LDL Cholesterol (Calc): 105 mg/dL (calc) — ABNORMAL HIGH
Non-HDL Cholesterol (Calc): 119 mg/dL (calc) (ref <130)
Triglycerides: 48 mg/dL (ref <150)

## 2017-08-10 LAB — BASIC METABOLIC PANEL WITH GFR
BUN: 19 mg/dL (ref 7–25)
CHLORIDE: 106 mmol/L (ref 98–110)
CO2: 25 mmol/L (ref 20–32)
Calcium: 8.8 mg/dL (ref 8.6–10.3)
Creat: 1 mg/dL (ref 0.70–1.33)
GFR, Est African American: 98 mL/min/{1.73_m2} (ref >=60)
GFR, Est Non African American: 85 mL/min/{1.73_m2} (ref >=60)
GLUCOSE: 108 mg/dL — AB (ref 65–99)
POTASSIUM: 4.2 mmol/L (ref 3.5–5.3)
SODIUM: 137 mmol/L (ref 135–146)

## 2017-08-10 LAB — TSH: TSH: 1.68 mIU/L (ref 0.40–4.50)

## 2017-08-10 LAB — VITAMIN D 25 HYDROXY (VIT D DEFICIENCY, FRACTURES): Vit D, 25-Hydroxy: 28 ng/mL — ABNORMAL LOW (ref 30–100)

## 2017-08-12 LAB — MAGNESIUM, RBC: MAGNESIUM RBC: 4.6 mg/dL (ref 4.0–6.4)

## 2017-08-19 ENCOUNTER — Telehealth: Payer: Self-pay

## 2017-08-19 NOTE — Telephone Encounter (Signed)
Per pt left detail message: cut micardis in half, push fluids, monitor both BP & HR at home.

## 2017-08-19 NOTE — Telephone Encounter (Signed)
-----   Message from Vicie Mutters, [place] sent at 08/19/2017 10:11 AM EDT ----- Regarding: RE: SIDE EFFECT Try cutting the micardis in half and only taking 1/2 the pill, push water, and monitor BP and HR.  Estill Bamberg  ----- Message ----- From: Elenor Quinones, CMA Sent: 08/19/2017   9:44 AM To: Vicie Mutters, PA-C Subject: SIDE EFFECT                                    Pt reports that new blood pressure med(MICARDIS) is causing some dizzy & light headedness. Please Advise?

## 2017-08-21 ENCOUNTER — Other Ambulatory Visit (INDEPENDENT_AMBULATORY_CARE_PROVIDER_SITE_OTHER): Payer: Self-pay | Admitting: Physician Assistant

## 2017-08-22 NOTE — Telephone Encounter (Signed)
Please advise 

## 2017-10-07 ENCOUNTER — Other Ambulatory Visit: Payer: Self-pay | Admitting: Physician Assistant

## 2017-10-07 DIAGNOSIS — J069 Acute upper respiratory infection, unspecified: Secondary | ICD-10-CM

## 2017-11-14 ENCOUNTER — Other Ambulatory Visit (INDEPENDENT_AMBULATORY_CARE_PROVIDER_SITE_OTHER): Payer: Self-pay | Admitting: Orthopaedic Surgery

## 2017-11-14 ENCOUNTER — Other Ambulatory Visit: Payer: Self-pay | Admitting: Physician Assistant

## 2017-11-15 NOTE — Telephone Encounter (Signed)
Please advise 

## 2018-01-28 ENCOUNTER — Other Ambulatory Visit (INDEPENDENT_AMBULATORY_CARE_PROVIDER_SITE_OTHER): Payer: Self-pay | Admitting: Orthopaedic Surgery

## 2018-01-29 NOTE — Telephone Encounter (Signed)
Please advise 

## 2018-02-28 ENCOUNTER — Ambulatory Visit (INDEPENDENT_AMBULATORY_CARE_PROVIDER_SITE_OTHER): Payer: 59 | Admitting: Physician Assistant

## 2018-02-28 ENCOUNTER — Encounter: Payer: Self-pay | Admitting: Physician Assistant

## 2018-02-28 VITALS — BP 120/86 | HR 73 | Temp 98.6°F | Ht 75.5 in | Wt 287.6 lb

## 2018-02-28 DIAGNOSIS — Z136 Encounter for screening for cardiovascular disorders: Secondary | ICD-10-CM

## 2018-02-28 DIAGNOSIS — I10 Essential (primary) hypertension: Secondary | ICD-10-CM

## 2018-02-28 DIAGNOSIS — E559 Vitamin D deficiency, unspecified: Secondary | ICD-10-CM

## 2018-02-28 DIAGNOSIS — E349 Endocrine disorder, unspecified: Secondary | ICD-10-CM

## 2018-02-28 DIAGNOSIS — Z6835 Body mass index (BMI) 35.0-35.9, adult: Secondary | ICD-10-CM

## 2018-02-28 DIAGNOSIS — Z0001 Encounter for general adult medical examination with abnormal findings: Secondary | ICD-10-CM

## 2018-02-28 DIAGNOSIS — E782 Mixed hyperlipidemia: Secondary | ICD-10-CM

## 2018-02-28 DIAGNOSIS — Z0000 Encounter for general adult medical examination without abnormal findings: Secondary | ICD-10-CM

## 2018-02-28 DIAGNOSIS — Z79899 Other long term (current) drug therapy: Secondary | ICD-10-CM

## 2018-02-28 DIAGNOSIS — R7303 Prediabetes: Secondary | ICD-10-CM

## 2018-02-28 DIAGNOSIS — Z130 Encounter for screening for diseases of the blood and blood-forming organs and certain disorders involving the immune mechanism: Secondary | ICD-10-CM

## 2018-02-28 DIAGNOSIS — K219 Gastro-esophageal reflux disease without esophagitis: Secondary | ICD-10-CM

## 2018-02-28 NOTE — Progress Notes (Signed)
Complete Physical  Assessment and Plan:  Essential hypertension - continue medications, DASH diet, exercise and monitor at home. Call if greater than 130/80.  With sinus brady and weight loss will cut atenolol in half and monitor bp at home -     CBC with Differential/Platelet -     BASIC METABOLIC PANEL WITH GFR -     Hepatic function panel -     TSH -     Microalbumin / creatinine urine ratio -     Urinalysis w microscopic + reflex cultur -     EKG 12-Lead  Seasonal allergic rhinitis, unspecified trigger Continue OTC meds  Arthritis Better with weight loss, follows ortho for knees  Mixed hyperlipidemia -     Lipid panel -continue medications, check lipids, decrease fatty foods, increase activity.   Prediabetes -     Hemoglobin A1c Great job with weight loss, will recheck, likely out of DM range - continue aASA, no ACE/ARB at this time  Class 2 severe obesity due to excess calories with serious comorbidity and body mass index (BMI) of 35.0 to 35.9 in adult [facility]) Morbid Obesity with co morbidities - long discussion about weight loss, diet, and exercise  Medication management -     Magnesium  Vitamin D deficiency -     VITAMIN D 25 Hydroxy (Vit-D Deficiency, Fractures)  Screening, anemia, deficiency, iron -     Iron,Total/Total Iron Binding Cap -     Vitamin B12  Encounter for general adult medical examination with abnormal findings 1 year   Discussed med's effects and SE's. Screening labs and tests as requested with regular follow-up as recommended. Future Appointments  Date Time Provider [place]  02/28/2018  9:30 AM Vicie Mutters, PA-C [facility] None  03/20/2019  9:30 AM Vicie Mutters, PA-C [facility] None    HPI Patient presents for a complete physical.   His blood pressure has been controlled at home, today their BP is BP: 120/86 He does not workout. He denies chest pain, shortness of breath, dizziness.    He is on cholesterol medication  and denies myalgias. His cholesterol is at goal. The cholesterol last visit was:   Lab Results  Component Value Date   CHOL 182 08/09/2017   HDL 63 08/09/2017   LDLCALC 105 (H) 08/09/2017   TRIG 48 08/09/2017   CHOLHDL 2.9 08/09/2017   He has been working on diet and exercise for diabetes, he is not on bASA, he is NOT on ACE/ARB and denies foot ulcerations, hyperglycemia, hypoglycemia , increased appetite, nausea, paresthesia of the feet, polydipsia, polyuria, visual disturbances, vomiting and weight loss. Last A1C in the office was:  Lab Results  Component Value Date   HGBA1C 6.1 (H) 08/09/2017   Lab Results  Component Value Date   GFRNONAA 85 08/09/2017   Patient is on Vitamin D supplement, 5000 IU Lab Results  Component Value Date   VD25OH 28 (L) 08/09/2017   He is on thyroid medication. His medication was not changed last visit.   Lab Results  Component Value Date   TSH 1.68 08/09/2017  .    Last PSA was: Lab Results  Component Value Date   PSA 0.47 11/18/2015   BMI is Body mass index is 35.47 kg/m., he has been stressed with his brother in hospital and has been sick, and he works 11 hour days so he states he has no time.   Wt Readings from Last 3 Encounters:  02/28/18 287 lb 9.6 oz (130.5  kg)  08/09/17 272 lb 12.8 oz (123.7 kg)  04/22/17 267 lb 3.2 oz (121.2 kg)    Current Medications:  Current Outpatient Medications on File Prior to Visit  Medication Sig Dispense Refill  . albuterol (VENTOLIN HFA) 108 (90 Base) MCG/ACT inhaler Inhale 2 puffs into the lungs every 4 (four) hours as needed for wheezing or shortness of breath. 1 Inhaler 2  . atenolol (TENORMIN) 100 MG tablet Take 1 tablet (100 mg total) by mouth daily. 30 tablet 3  . celecoxib (CELEBREX) 200 MG capsule Take 1 capsule (200 mg total) 2 (two) times daily by mouth. 30 capsule 3  . diclofenac sodium (VOLTAREN) 1 % GEL Apply 2 g 4 (four) times daily topically. 1 Tube 5  . etodolac (LODINE) 500 MG tablet  Take 500 mg by mouth 2 (two) times daily.    . Multiple Vitamins-Minerals (MULTIVITAMIN WITH MINERALS) tablet Take 1 tablet by mouth daily.    Marland Kitchen OVER THE COUNTER MEDICATION     . telmisartan (MICARDIS) 40 MG tablet TAKE 1 TABLET(40 MG) BY MOUTH DAILY 90 tablet 1  . traMADol (ULTRAM) 50 MG tablet TAKE 1 TABLET BY MOUTH EVERY 6 HOURS AS NEEDED FOR PAIN 40 tablet 0  . azelastine (ASTELIN) 0.1 % nasal spray Place 2 sprays into both nostrils 2 (two) times daily. Use in each nostril as directed 30 mL 2   No current facility-administered medications on file prior to visit.     Health Maintenance:  Immunization History  Administered Date(s) Administered  . Td 06/12/2005  . Tdap 01/18/2012   Tetanus: 2013 Flu vaccine:Declined Zostavax: Not indicated Colonoscopy: 2014 Eye Exam: 2018 DUE, Dr. [place] Dentist: Twice yearly visits  Patient Care Team: Unk Pinto, MD as PCP - General (Internal Medicine) Irene Shipper, MD as Consulting Physician (Gastroenterology)  Medical History:  Past Medical History:  Diagnosis Date  . Allergic rhinitis, cause unspecified   . Arthritis    neck and knees  . Hyperlipidemia   . Hypertension   . Prediabetes    Allergies No Known Allergies  SURGICAL HISTORY He  has no past surgical history on file. FAMILY HISTORY His family history includes Arthritis in his mother; Hypertension in his father and mother; Kidney disease in his father; Sleep apnea in his mother. SOCIAL HISTORY He  reports that he has never smoked. He has never used smokeless tobacco. He reports that he does not drink alcohol or use drugs.   Review of Systems:  Review of Systems  Constitutional: Negative for chills, fever and malaise/fatigue.  HENT: Negative for congestion, ear pain and sore throat.   Respiratory: Negative for cough, shortness of breath and wheezing.   Cardiovascular: Negative for chest pain, palpitations and leg swelling.  Gastrointestinal: Positive for abdominal  pain and diarrhea. Negative for blood in stool, constipation, heartburn, melena, nausea and vomiting.  Genitourinary: Negative.   Neurological: Negative for dizziness, sensory change, loss of consciousness and headaches.  Psychiatric/Behavioral: Negative for depression and memory loss. The patient is not nervous/anxious and does not have insomnia.     Physical Exam: Estimated body mass index is 35.47 kg/m as calculated from the following:   Height as of this encounter: 6' 3.5" (1.918 m).   Weight as of this encounter: 287 lb 9.6 oz (130.5 kg). BP 120/86   Pulse 73   Temp 98.6 F (37 C)   Ht 6' 3.5" (1.918 m)   Wt 287 lb 9.6 oz (130.5 kg)   SpO2 98%  BMI 35.47 kg/m   General Appearance: Well nourished, in no apparent distress.  Eyes: PERRLA, EOMs, conjunctiva no swelling or erythema ENT/Mouth: Ear canals clear bilaterally with no erythema, swelling, discharge.  TMs normal bilaterally with no erythema, bulging, or retractions.  Oropharynx clear and moist with no exudate, swelling, or erythema.  Dentition normal.   Neck: Supple, thyroid normal. No bruits, JVD, cervical adenopathy Respiratory: Respiratory effort normal, BS equal bilaterally without rales, rhonchi, wheezing or stridor.  Cardio: RRR without murmurs, rubs or gallops. Brisk peripheral pulses without edema.  Chest: symmetric, with normal excursions Abdomen: Soft, nontender, no guarding, rebound, hernias, masses, or organomegaly. Musculoskeletal: Full ROM all peripheral extremities,5/5 strength, and normal gait.  Skin: Warm, dry without rashes, lesions, ecchymosis. Neuro: A&Ox3, Cranial nerves intact, reflexes equal bilaterally. Normal muscle tone, no cerebellar symptoms. Sensation intact.  Psych: Normal affect, Insight and Judgment appropriate.   EKG: WNL no changes.  AORTA SCAN: WNL  Over 40 minutes of exam, counseling, chart review and critical decision making was performed  Vicie Mutters 9:28 AM [facility]

## 2018-02-28 NOTE — Patient Instructions (Addendum)
Check out  Mini habits for weight loss book  2 apps for tracking food is myfitness pal  loseit OR can take picture of your food  Diet soda leads to weight gain.  We recently discovered that the artifical sugar in the soda stops an enzyme in your stomach that is suppose to signal that your brain is full. So patients that drink a lot of diet soda will never feel full and tend to over eat. So please cut back on diet soda and it can help with your weight loss.   A study found that two or more sodas a day increased the risk of hypertension, diabetes, stroke.   Another recent study found that higher consumption of soda and juice was associated with increased risk of cancer and breast cancer.   In animal studies artifical sweeteners affect the gut bacteria and may contribute to chronic disease this way.      When it comes to diets, agreement about the perfect plan isn't easy to find, even among the experts. Experts at the [employer] developed an idea known as the Healthy Eating Plate. Just imagine a plate divided into logical, healthy portions.  The emphasis is on diet quality:  Load up on vegetables and fruits - one-half of your plate: Aim for color and variety, and remember that potatoes don't count.  Go for whole grains - one-quarter of your plate: Whole wheat, barley, wheat berries, quinoa, oats, brown rice, and foods made with them. If you want pasta, go with whole wheat pasta.  Protein power - one-quarter of your plate: Fish, chicken, beans, and nuts are all healthy, versatile protein sources. Limit red meat.  The diet, however, does go beyond the plate, offering a few other suggestions.  Use healthy plant oils, such as olive, canola, soy, corn, sunflower and peanut. Check the labels, and avoid partially hydrogenated oil, which have unhealthy trans fats.  If you're thirsty, drink water. Coffee and tea are good in moderation, but skip sugary drinks and limit milk  and dairy products to one or two daily servings.  The type of carbohydrate in the diet is more important than the amount. Some sources of carbohydrates, such as vegetables, fruits, whole grains, and beans-are healthier than others.  Finally, stay active.  ALLERGY MEDICATIONS OVER THE COUNTER  Please pick one of the over the counter allergy medications below and take it once daily for allergies.  Claritin or loratadine cheapest but likely the weakest  Zyrtec or certizine at night because it can make you sleepy The strongest is allegra or fexafinadine  Cheapest at [employer], [employer], [employer]  VENOUS INSUFFICIENCY Our lower leg venous system is not the most reliable, the heart does NOT pump fluid up, there is a valve system.  The muscles of the leg squeeze and the blood moves up and a valve opens and close, then they squeeze, blood moves up and valves open and closes keeping the blood moving towards the heart.  Lots can go wrong with this valve system.  If someone is sitting or standing without movement, everyone will get swelling.  THINGS TO DO:  Do not stand or sit in one position for long periods of time. Do not sit with your legs crossed. Rest with your legs raised during the day.  Your legs have to be higher than your heart so that gravity will force the valves to open, so please really elevate your legs.   Wear elastic stockings or support hose.  Do not wear other tight, encircling garments around the legs, pelvis, or waist.  [employer]  has a wide variety of well priced compression stockings. [place], [place] [address] [address] [place] has a good cheap selection, I like the socks, they are not as hard to get on  Walk as much as possible to increase blood flow.  Raise the foot of your bed at night with 2-inch blocks.  SEEK MEDICAL CARE IF:   The skin around your ankle starts to break down.  You have pain, redness, tenderness, or hard swelling  developing in your leg over a vein.  You are uncomfortable due to leg pain.  If you ever have shortness of breath with exertion or chest pain go to the ER.    Vitamin D goal is between 60-80  Please make sure that you are taking your Vitamin D as directed.   It is very important as a natural anti-inflammatory   helping hair, skin, and nails, as well as reducing stroke and heart attack risk.   It helps your bones and helps with mood.  We want you on at least 5000 IU daily  It also decreases numerous cancer risks so please take it as directed.   Low Vit D is associated with a 200-300% higher risk for CANCER   and 200-300% higher risk for HEART   ATTACK  &  STROKE.    .....................................Marland Kitchen  It is also associated with higher death rate at younger ages,   autoimmune diseases like Rheumatoid arthritis, Lupus, Multiple Sclerosis.     Also many other serious conditions, like depression, Alzheimer's  Dementia, infertility, muscle aches, fatigue, fibromyalgia - just to name a few.  +++++++++++++++++++  Can get liquid vitamin D from [facility] here in [place] at  [facility] alternatives [address], [place], [place] [address] Or you can try earth fare

## 2018-03-01 LAB — URINALYSIS, ROUTINE W REFLEX MICROSCOPIC
BILIRUBIN URINE: NEGATIVE
Glucose, UA: NEGATIVE
Hgb urine dipstick: NEGATIVE
KETONES UR: NEGATIVE
Leukocytes, UA: NEGATIVE
NITRITE: NEGATIVE
PROTEIN: NEGATIVE
SPECIFIC GRAVITY, URINE: 1.031 (ref 1.001–1.03)
pH: <=5 (ref 5.0–8.0)

## 2018-03-01 LAB — CBC WITH DIFFERENTIAL/PLATELET
BASOS ABS: 51 {cells}/uL (ref 0–200)
Basophils Relative: 0.8 %
EOS PCT: 3.4 %
Eosinophils Absolute: 218 cells/uL (ref 15–500)
HCT: 41.8 % (ref 38.5–50.0)
Hemoglobin: 14.6 g/dL (ref 13.2–17.1)
Lymphs Abs: 1485 cells/uL (ref 850–3900)
MCH: 31.9 pg (ref 27.0–33.0)
MCHC: 34.9 g/dL (ref 32.0–36.0)
MCV: 91.3 fL (ref 80.0–100.0)
MPV: 11.6 fL (ref 7.5–12.5)
Monocytes Relative: 10 %
NEUTROS PCT: 62.6 %
Neutro Abs: 4006 cells/uL (ref 1500–7800)
PLATELETS: 182 10*3/uL (ref 140–400)
RBC: 4.58 10*6/uL (ref 4.20–5.80)
RDW: 12.5 % (ref 11.0–15.0)
TOTAL LYMPHOCYTE: 23.2 %
WBC mixed population: 640 cells/uL (ref 200–950)
WBC: 6.4 10*3/uL (ref 3.8–10.8)

## 2018-03-01 LAB — VITAMIN B12: VITAMIN B 12: 643 pg/mL (ref 200–1100)

## 2018-03-01 LAB — IRON, TOTAL/TOTAL IRON BINDING CAP
%SAT: 53 % — AB (ref 20–48)
Iron: 166 ug/dL (ref 50–180)
TIBC: 313 mcg/dL (calc) (ref 250–425)

## 2018-03-01 LAB — COMPLETE METABOLIC PANEL WITH GFR
AG RATIO: 1.9 (calc) (ref 1.0–2.5)
ALBUMIN MSPROF: 4.2 g/dL (ref 3.6–5.1)
ALKALINE PHOSPHATASE (APISO): 96 U/L (ref 40–115)
ALT: 35 U/L (ref 9–46)
AST: 28 U/L (ref 10–35)
BILIRUBIN TOTAL: 1 mg/dL (ref 0.2–1.2)
BUN: 20 mg/dL (ref 7–25)
CO2: 27 mmol/L (ref 20–32)
CREATININE: 1.04 mg/dL (ref 0.70–1.33)
Calcium: 9.1 mg/dL (ref 8.6–10.3)
Chloride: 103 mmol/L (ref 98–110)
GFR, Est African American: 93 mL/min/{1.73_m2} (ref >=60)
GFR, Est Non African American: 80 mL/min/{1.73_m2} (ref >=60)
GLOBULIN: 2.2 g/dL (ref 1.9–3.7)
Glucose, Bld: 174 mg/dL — ABNORMAL HIGH (ref 65–99)
Potassium: 4.5 mmol/L (ref 3.5–5.3)
SODIUM: 138 mmol/L (ref 135–146)
Total Protein: 6.4 g/dL (ref 6.1–8.1)

## 2018-03-01 LAB — LIPID PANEL
CHOL/HDL RATIO: 3.7 (calc) (ref <5.0)
CHOLESTEROL: 182 mg/dL (ref <200)
HDL: 49 mg/dL (ref >40)
LDL Cholesterol (Calc): 114 mg/dL (calc) — ABNORMAL HIGH
Non-HDL Cholesterol (Calc): 133 mg/dL (calc) — ABNORMAL HIGH (ref <130)
Triglycerides: 87 mg/dL (ref <150)

## 2018-03-01 LAB — TSH: TSH: 2.52 mIU/L (ref 0.40–4.50)

## 2018-03-01 LAB — MAGNESIUM: Magnesium: 1.8 mg/dL (ref 1.5–2.5)

## 2018-03-01 LAB — HEMOGLOBIN A1C
EAG (MMOL/L): 9 (calc)
Hgb A1c MFr Bld: 7.3 % of total Hgb — ABNORMAL HIGH (ref <5.7)
MEAN PLASMA GLUCOSE: 163 (calc)

## 2018-03-01 LAB — MICROALBUMIN / CREATININE URINE RATIO
Creatinine, Urine: 248 mg/dL (ref 20–320)
MICROALB/CREAT RATIO: 4 ug/mg{creat} (ref <30)
Microalb, Ur: 0.9 mg/dL

## 2018-03-01 LAB — VITAMIN D 25 HYDROXY (VIT D DEFICIENCY, FRACTURES): VIT D 25 HYDROXY: 30 ng/mL (ref 30–100)

## 2018-03-19 ENCOUNTER — Encounter: Payer: Self-pay | Admitting: Internal Medicine

## 2018-04-04 ENCOUNTER — Other Ambulatory Visit: Payer: Self-pay | Admitting: Internal Medicine

## 2018-04-04 MED ORDER — ATENOLOL 100 MG PO TABS: ORAL_TABLET | ORAL | 1 refills | Status: DC

## 2018-04-04 MED ORDER — TELMISARTAN 40 MG PO TABS: ORAL_TABLET | ORAL | 1 refills | Status: DC

## 2018-04-10 NOTE — Patient Instructions (Addendum)
I would like to check you blood glucose every morning before your first meal AND if you are feeling bad check and record.  Start Metformin 59mXR, take half a tablet for two weeks.  THEN take whole tablet.  Take with largest meal of the day.  Biggest side effect is GI, diarrhea, this will decrease and go away.  We have sent in an order for Diabetic supplies to check your blood glucose.    Carbohydrate Counting for Diabetes Mellitus, Adult Carbohydrate counting is a method for keeping track of how many carbohydrates you eat. Eating carbohydrates naturally increases the amount of sugar (glucose) in the blood. Counting how many carbohydrates you eat helps keep your blood glucose within normal limits, which helps you manage your diabetes (diabetes mellitus). It is important to know how many carbohydrates you can safely have in each meal. This is different for every person. A diet and nutrition specialist (registered dietitian) can help you make a meal plan and calculate how many carbohydrates you should have at each meal and snack. Carbohydrates are found in the following foods:  Grains, such as breads and cereals.  Dried beans and soy products.  Starchy vegetables, such as potatoes, peas, and corn.  Fruit and fruit juices.  Milk and yogurt.  Sweets and snack foods, such as cake, cookies, candy, chips, and soft drinks.  How do I count carbohydrates? There are two ways to count carbohydrates in food. You can use either of the methods or a combination of both. Reading "Nutrition Facts" on packaged food The "Nutrition Facts" list is included on the labels of almost all packaged foods and beverages in the [place] It includes:  The serving size.  Information about nutrients in each serving, including the grams (g) of carbohydrate per serving.  To use the "Nutrition Facts":  Decide how many servings you will have.  Multiply the number of servings by the number of carbohydrates per  serving.  The resulting number is the total amount of carbohydrates that you will be having.  Learning standard serving sizes of other foods When you eat foods containing carbohydrates that are not packaged or do not include "Nutrition Facts" on the label, you need to measure the servings in order to count the amount of carbohydrates:  Measure the foods that you will eat with a food scale or measuring cup, if needed.  Decide how many standard-size servings you will eat.  Multiply the number of servings by 15. Most carbohydrate-rich foods have about 15 g of carbohydrates per serving. ? For example, if you eat 8 oz (170 g) of strawberries, you will have eaten 2 servings and 30 g of carbohydrates (2 servings x 15 g = 30 g).  For foods that have more than one food mixed, such as soups and casseroles, you must count the carbohydrates in each food that is included.  The following list contains standard serving sizes of common carbohydrate-rich foods. Each of these servings has about 15 g of carbohydrates:   hamburger bun or  English muffin.   oz (15 mL) syrup.   oz (14 g) jelly.  1 slice of bread.  1 six-inch tortilla.  3 oz (85 g) cooked rice or pasta.  4 oz (113 g) cooked dried beans.  4 oz (113 g) starchy vegetable, such as peas, corn, or potatoes.  4 oz (113 g) hot cereal.  4 oz (113 g) mashed potatoes or  of a large baked potato.  4 oz (113 g) canned or  frozen fruit.  4 oz (120 mL) fruit juice.  4-6 crackers.  6 chicken nuggets.  6 oz (170 g) unsweetened dry cereal.  6 oz (170 g) plain fat-free yogurt or yogurt sweetened with artificial sweeteners.  8 oz (240 mL) milk.  8 oz (170 g) fresh fruit or one small piece of fruit.  24 oz (680 g) popped popcorn.  Example of carbohydrate counting Sample meal  3 oz (85 g) chicken breast.  6 oz (170 g) brown rice.  4 oz (113 g) corn.  8 oz (240 mL) milk.  8 oz (170 g) strawberries with sugar-free whipped  topping. Carbohydrate calculation 1. Identify the foods that contain carbohydrates: ? Rice. ? Corn. ? Milk. ? Strawberries. 2. Calculate how many servings you have of each food: ? 2 servings rice. ? 1 serving corn. ? 1 serving milk. ? 1 serving strawberries. 3. Multiply each number of servings by 15 g: ? 2 servings rice x 15 g = 30 g. ? 1 serving corn x 15 g = 15 g. ? 1 serving milk x 15 g = 15 g. ? 1 serving strawberries x 15 g = 15 g. 4. Add together all of the amounts to find the total grams of carbohydrates eaten: ? 30 g + 15 g + 15 g + 15 g = 75 g of carbohydrates total. This information is not intended to replace advice given to you by your health care provider. Make sure you discuss any questions you have with your health care provider. Document Released: 05/07/2005 Document Revised: 11/25/2015 Document Reviewed: 10/19/2015 Elsevier Interactive Patient Education  2018 [employer].    Blood Glucose Monitoring, Adult Monitoring your blood sugar (glucose) helps you manage your diabetes. It also helps you and your health care provider determine how well your diabetes management plan is working. Blood glucose monitoring involves checking your blood glucose as often as directed, and keeping a record (log) of your results over time. Why should I monitor my blood glucose? Checking your blood glucose regularly can:  Help you understand how food, exercise, illnesses, and medicines affect your blood glucose.  Let you know what your blood glucose is at any time. You can quickly tell if you are having low blood glucose (hypoglycemia) or high blood glucose (hyperglycemia).  Help you and your health care provider adjust your medicines as needed.  When should I check my blood glucose? Follow instructions from your health care provider about how often to check your blood glucose. This may depend on:  The type of diabetes you have.  How well-controlled your diabetes is.  Medicines  you are taking.  If you have type 1 diabetes:  Check your blood glucose at least 2 times a day.  Also check your blood glucose: ? Before every insulin injection. ? Before and after exercise. ? Between meals. ? 2 hours after a meal. ? Occasionally between 2:00 a.m. and 3:00 a.m., as directed. ? Before potentially dangerous tasks, like driving or using heavy machinery. ? At bedtime.  You may need to check your blood glucose more often, up to 6-10 times a day: ? If you use an insulin pump. ? If you need multiple daily injections (MDI). ? If your diabetes is not well-controlled. ? If you are ill. ? If you have a history of severe hypoglycemia. ? If you have a history of not knowing when your blood glucose is getting low (hypoglycemia unawareness). If you have type 2 diabetes:  If you take insulin  or other diabetes medicines, check your blood glucose at least 2 times a day.  If you are on intensive insulin therapy, check your blood glucose at least 4 times a day. Occasionally, you may also need to check between 2:00 a.m. and 3:00 a.m., as directed.  Also check your blood glucose: ? Before and after exercise. ? Before potentially dangerous tasks, like driving or using heavy machinery.  You may need to check your blood glucose more often if: ? Your medicine is being adjusted. ? Your diabetes is not well-controlled. ? You are ill. What is a blood glucose log?  A blood glucose log is a record of your blood glucose readings. It helps you and your health care provider: ? Look for patterns in your blood glucose over time. ? Adjust your diabetes management plan as needed.  Every time you check your blood glucose, write down your result and notes about things that may be affecting your blood glucose, such as your diet and exercise for the day.  Most glucose meters store a record of glucose readings in the meter. Some meters allow you to download your records to a computer. How do I  check my blood glucose? Follow these steps to get accurate readings of your blood glucose: Supplies needed   Blood glucose meter.  Test strips for your meter. Each meter has its own strips. You must use the strips that come with your meter.  A needle to prick your finger (lancet). Do not use lancets more than once.  A device that holds the lancet (lancing device).  A journal or log book to write down your results. Procedure  Wash your hands with soap and water.  Prick the side of your finger (not the tip) with the lancet. Use a different finger each time.  Gently rub the finger until a small drop of blood appears.  Follow instructions that come with your meter for inserting the test strip, applying blood to the strip, and using your blood glucose meter.  Write down your result and any notes. Alternative testing sites  Some meters allow you to use areas of your body other than your finger (alternative sites) to test your blood.  If you think you may have hypoglycemia, or if you have hypoglycemia unawareness, do not use alternative sites. Use your finger instead.  Alternative sites may not be as accurate as the fingers, because blood flow is slower in these areas. This means that the result you get may be delayed, and it may be different from the result that you would get from your finger.  The most common alternative sites are: ? Forearm. ? Thigh. ? Palm of the hand. Additional tips  Always keep your supplies with you.  If you have questions or need help, all blood glucose meters have a 24-hour "hotline" number that you can call. You may also contact your health care provider.  After you use a few boxes of test strips, adjust (calibrate) your blood glucose meter by following instructions that came with your meter. This information is not intended to replace advice given to you by your health care provider. Make sure you discuss any questions you have with your health care  provider. Document Released: 05/10/2003 Document Revised: 11/25/2015 Document Reviewed: 10/17/2015 Elsevier Interactive Patient Education  2017 [place].    Hyperglycemia Hyperglycemia is when the sugar (glucose) level in your blood is too high. It may not cause symptoms. If you do have symptoms, they may include warning  signs, such as:  Feeling more thirsty than normal.  Hunger.  Feeling tired.  Needing to pee (urinate) more than normal.  Blurry eyesight (vision).  You may get other symptoms as it gets worse, such as:  Dry mouth.  Not being hungry (loss of appetite).  Fruity-smelling breath.  Weakness.  Weight gain or loss that is not planned. Weight loss may be fast.  A tingling or numb feeling in your hands or feet.  Headache.  Skin that does not bounce back quickly when it is lightly pinched and released (poor skin turgor).  Pain in your belly (abdomen).  Cuts or bruises that heal slowly.  High blood sugar can happen to people who do or do not have diabetes. High blood sugar can happen slowly or quickly, and it can be an emergency. Follow these instructions at home: General instructions  Take over-the-counter and prescription medicines only as told by your doctor.  Do not use products that contain nicotine or tobacco, such as cigarettes and e-cigarettes. If you need help quitting, ask your doctor.  Limit alcohol intake to no more than 1 drink per day for nonpregnant women and 2 drinks per day for men. One drink equals 12 oz of beer, 5 oz of wine, or 1 oz of hard liquor.  Manage stress. If you need help with this, ask your doctor.  Keep all follow-up visits as told by your doctor. This is important. Eating and drinking  Stay at a healthy weight.  Exercise regularly, as told by your doctor.  Drink enough fluid, especially when you: ? Exercise. ? Get sick. ? Are in hot temperatures.  Eat healthy foods, such as: ? Low-fat (lean)  proteins. ? Complex carbs (complex carbohydrates), such as whole wheat bread or brown rice. ? Fresh fruits and vegetables. ? Low-fat dairy products. ? Healthy fats.  Drink enough fluid to keep your pee (urine) clear or pale yellow. If you have diabetes:  Make sure you know the symptoms of hyperglycemia.  Follow your diabetes management plan, as told by your doctor. Make sure you: ? Take insulin and medicines as told. ? Follow your exercise plan. ? Follow your meal plan. Eat on time. Do not skip meals. ? Check your blood sugar as often as told. Make sure to check before and after exercise. If you exercise longer or in a different way than you normally do, check your blood sugar more often. ? Follow your sick day plan whenever you cannot eat or drink normally. Make this plan ahead of time with your doctor.  Share your diabetes management plan with people in your workplace, school, and household.  Check your urine for ketones when you are ill and as told by your doctor.  Carry a card or wear jewelry that says that you have diabetes. Contact a doctor if:  Your blood sugar level is higher than 240 mg/dL (13.3 mmol/L) for 2 days in a row.  You have problems keeping your blood sugar in your target range.  High blood sugar happens often for you. Get help right away if:  You have trouble breathing.  You have a change in how you think, feel, or act (mental status).  You feel sick to your stomach (nauseous), and that feeling does not go away.  You cannot stop throwing up (vomiting). These symptoms may be an emergency. Do not wait to see if the symptoms will go away. Get medical help right away. Call your local emergency services (911 in the [place]  Do not drive yourself to the hospital. Summary  Hyperglycemia is when the sugar (glucose) level in your blood is too high.  High blood sugar can happen to people who do or do not have diabetes.  Make sure you drink enough fluids, eat  healthy foods, and exercise regularly.  Contact your doctor if you have problems keeping your blood sugar in your target range. This information is not intended to replace advice given to you by your health care provider. Make sure you discuss any questions you have with your health care provider. Document Released: 03/04/2009 Document Revised: 01/23/2016 Document Reviewed: 01/23/2016 Elsevier Interactive Patient Education  2017 [place].  Hypoglycemia Hypoglycemia is when the sugar (glucose) level in the blood is too low. Symptoms of low blood sugar may include: Feeling: Hungry. Worried or nervous (anxious). Sweaty and clammy. Confused. Dizzy. Sleepy. Sick to your stomach (nauseous). Having: A fast heartbeat. A headache. A change in your vision. Jerky movements that you cannot control (seizure). Nightmares. Tingling or no feeling (numbness) around the mouth, lips, or tongue. Having trouble with: Talking. Paying attention (concentrating). Moving (coordination). Sleeping. Shaking. Passing out (fainting). Getting upset easily (irritability).  Low blood sugar can happen to people who have diabetes and people who do not have diabetes. Low blood sugar can happen quickly, and it can be an emergency. Treating Low Blood Sugar Low blood sugar is often treated by eating or drinking something sugary right away. If you can think clearly and swallow safely, follow the 15:15 rule: Take 15 grams of a fast-acting carb (carbohydrate). Some fast-acting carbs are: 1 tube of glucose gel. 3 sugar tablets (glucose pills). 6-8 pieces of hard candy. 4 oz (120 mL) of fruit juice. 4 oz (120 mL) of regular (not diet) soda. Check your blood sugar 15 minutes after you take the carb. If your blood sugar is still at or below 70 mg/dL (3.9 mmol/L), take 15 grams of a carb again. If your blood sugar does not go above 70 mg/dL (3.9 mmol/L) after 3 tries, get help right away. After your blood sugar  goes back to normal, eat a meal or a snack within 1 hour.  Treating Very Low Blood Sugar If your blood sugar is at or below 54 mg/dL (3 mmol/L), you have very low blood sugar (severe hypoglycemia). This is an emergency. Do not wait to see if the symptoms will go away. Get medical help right away. Call your local emergency services (911 in the [place] Do not drive yourself to the hospital. If you have very low blood sugar and you cannot eat or drink, you may need a glucagon shot (injection). A family member or friend should learn how to check your blood sugar and how to give you a glucagon shot. Ask your doctor if you need to have a glucagon shot kit at home. Follow these instructions at home: General instructions Avoid any diets that cause you to not eat enough food. Talk with your doctor before you start any new diet. Take over-the-counter and prescription medicines only as told by your doctor. Limit alcohol to no more than 1 drink per day for nonpregnant women and 2 drinks per day for men. One drink equals 12 oz of beer, 5 oz of wine, or 1 oz of hard liquor. Keep all follow-up visits as told by your doctor. This is important. If You Have Diabetes:  Make sure you know the symptoms of low blood sugar. Always keep a source of sugar with you, such  as: Sugar. Sugar tablets. Glucose gel. Fruit juice. Regular soda (not diet soda). Milk. Hard candy. Honey. Take your medicines as told. Follow your exercise and meal plan. Eat on time. Do not skip meals. Follow your sick day plan when you cannot eat or drink normally. Make this plan ahead of time with your doctor. Check your blood sugar as often as told by your doctor. Always check before and after exercise. Share your diabetes care plan with: Your work or school. People you live with. Check your pee (urine) for ketones: When you are sick. As told by your doctor. Carry a card or wear jewelry that says you have diabetes. If You Have Low  Blood Sugar From Other Causes:  Check your blood sugar as often as told by your doctor. Follow instructions from your doctor about what you cannot eat or drink. Contact a doctor if: You have trouble keeping your blood sugar in your target range. You have low blood sugar often. Get help right away if: You still have symptoms after you eat or drink something sugary. Your blood sugar is at or below 54 mg/dL (3 mmol/L). You have jerky movements that you cannot control. You pass out. These symptoms may be an emergency. Do not wait to see if the symptoms will go away. Get medical help right away. Call your local emergency services (911 in the [place] Do not drive yourself to the hospital. This information is not intended to replace advice given to you by your health care provider. Make sure you discuss any questions you have with your health care provider. Document Released: 08/01/2009 Document Revised: 10/13/2015 Document Reviewed: 06/10/2015 Elsevier Interactive Patient Education  [employer].

## 2018-04-10 NOTE — Progress Notes (Deleted)
Diabetes Education and Follow-Up Visit  57 y.o.male presents for diabetic education. He has Diabetes Mellitus type 2:  {with or without complications:30421263}, he {ACTION; IS/IS NOT:21021397} on bASA, and denies {Symptoms; diabetes w/o none:19199}.  Last hemoglobin A1c was: Lab Results  Component Value Date   HGBA1C 7.3 (H) 02/28/2018   HGBA1C 6.1 (H) 08/09/2017   HGBA1C 5.8 (H) 02/01/2017    There is no height or weight on file to calculate BMI.  Pt is on a regimen of: {Treatments; meds diabetes oral:16626}  Pt checks his sugars {1-4:31454} x day  Lowest sugar was ***.  He has hypoglycemia awareness.  Highest sugar was ***.  Glucometer:   Exercise:  Patient {DOES NOT does:27190::"does not"} have CKD He {ACTION; IS/IS KGM:01027253} on ACE/ARB  Lab Results  Component Value Date   GFRAA 93 02/28/2018      Lab Results  Component Value Date   GFRNONAA 80 02/28/2018    Lab Results  Component Value Date   CREATININE 1.04 02/28/2018   BUN 20 02/28/2018   NA 138 02/28/2018   K 4.5 02/28/2018   CL 103 02/28/2018   CO2 27 02/28/2018    Lab Results  Component Value Date   MICROALBUR 0.9 02/28/2018     He {ACTION; IS/IS GUY:40347425} on a Statin.  He {ACTION; IS/IS ZDG:38756433} at goal of less than 70.  Lab Results  Component Value Date   CHOL 182 02/28/2018   HDL 49 02/28/2018   LDLCALC 114 (H) 02/28/2018   TRIG 87 02/28/2018   CHOLHDL 3.7 02/28/2018     Problem List has Hypertension; Arthritis; Allergic rhinitis; Hyperlipidemia; Prediabetes; and Obesity on their problem list.  Medications Current Outpatient Medications on File Prior to Visit  Medication Sig  . albuterol (VENTOLIN HFA) 108 (90 Base) MCG/ACT inhaler Inhale 2 puffs into the lungs every 4 (four) hours as needed for wheezing or shortness of breath.  Marland Kitchen atenolol (TENORMIN) 100 MG tablet Take 1 tablet daily for BP  . azelastine (ASTELIN) 0.1 % nasal spray Place 2 sprays into both nostrils 2  (two) times daily. Use in each nostril as directed  . celecoxib (CELEBREX) 200 MG capsule Take 1 capsule (200 mg total) 2 (two) times daily by mouth.  . diclofenac sodium (VOLTAREN) 1 % GEL Apply 2 g 4 (four) times daily topically.  Marland Kitchen etodolac (LODINE) 500 MG tablet Take 500 mg by mouth 2 (two) times daily.  . Multiple Vitamins-Minerals (MULTIVITAMIN WITH MINERALS) tablet Take 1 tablet by mouth daily.  Marland Kitchen OVER THE COUNTER MEDICATION   . telmisartan (MICARDIS) 40 MG tablet Take 1 tablet daily for BP  . traMADol (ULTRAM) 50 MG tablet TAKE 1 TABLET BY MOUTH EVERY 6 HOURS AS NEEDED FOR PAIN   No current facility-administered medications on file prior to visit.     ROS- see HPI  Physical Exam: There were no vitals taken for this visit. There is no height or weight on file to calculate BMI. General Appearance: Well nourished, in no apparent distress. Eyes: PERRLA, EOMs, conjunctiva no swelling or erythema ENT/Mouth: Ext aud canals clear, TMs without erythema, bulging. No erythema, swelling, or exudate on post pharynx.  Tonsils not swollen or erythematous. Hearing normal.  Respiratory: Respiratory effort normal, BS equal bilaterally without rales, rhonchi, wheezing or stridor.  Cardio: RRR with no MRGs. Brisk peripheral pulses without edema.  Abdomen: Soft, + BS.  Non tender, no guarding, rebound, hernias, masses. Musculoskeletal: Full ROM, 5/5 strength, normal gait.  Skin: Warm, dry without  rashes, lesions, ecchymosis.  Neuro: Cranial nerves intact. Normal muscle tone, no cerebellar symptoms. Sensation intact.    Plan and Assessment: Diabetes Education: Reviewed 'ABCs' of diabetes management (respective goals in parentheses):  A1C (<7), blood pressure (<130/80), and cholesterol (LDL <70) Eye Exam yearly and Dental Exam every 6 months. Dietary recommendations Physical Activity recommendations - Strongly advised him to start checking sugars at different times of the day - check 2 times a  day, rotating checks - given sugar log and advised how to fill it and to bring it at next appt  - given foot care handout and explained the principles  - given instructions for hypoglycemia management    Future Appointments  Date Time Provider [place]  04/11/2018  9:00 AM Garnet Sierras, NP [facility] None  07/04/2018 10:15 AM Vicie Mutters, PA-C [facility] None  03/20/2019  9:30 AM Vicie Mutters, PA-C [facility] None

## 2018-04-11 ENCOUNTER — Encounter: Payer: Self-pay | Admitting: Adult Health Nurse Practitioner

## 2018-04-11 ENCOUNTER — Ambulatory Visit (INDEPENDENT_AMBULATORY_CARE_PROVIDER_SITE_OTHER): Payer: 59 | Admitting: Adult Health Nurse Practitioner

## 2018-04-11 ENCOUNTER — Ambulatory Visit: Payer: Self-pay | Admitting: Physician Assistant

## 2018-04-11 VITALS — BP 134/90 | HR 61 | Temp 98.1°F | Ht 75.5 in | Wt 275.6 lb

## 2018-04-11 DIAGNOSIS — E1165 Type 2 diabetes mellitus with hyperglycemia: Secondary | ICD-10-CM | POA: Diagnosis not present

## 2018-04-11 DIAGNOSIS — E782 Mixed hyperlipidemia: Secondary | ICD-10-CM

## 2018-04-11 DIAGNOSIS — I10 Essential (primary) hypertension: Secondary | ICD-10-CM

## 2018-04-11 DIAGNOSIS — E66812 Obesity, class 2: Secondary | ICD-10-CM

## 2018-04-11 MED ORDER — METFORMIN HCL ER 500 MG PO TB24: ORAL_TABLET | ORAL | 11 refills | Status: DC

## 2018-04-11 MED ORDER — BLOOD GLUCOSE MONITORING SUPPL DEVI: 0 refills | Status: AC

## 2018-04-11 MED ORDER — GLUCOSE BLOOD VI STRP: ORAL_STRIP | 12 refills | Status: AC

## 2018-04-11 MED ORDER — ACCU-CHEK SOFT TOUCH LANCETS MISC: 12 refills | Status: AC

## 2018-04-11 NOTE — Progress Notes (Signed)
Diabetes Education and Follow-Up Visit  57 y.o.male presents for diabetic education. He has Diabetes Mellitus type 2:  without complications, he is not on bASA, and denies foot ulcerations, hyperglycemia, hypoglycemia , nausea, polydipsia, polyuria and visual disturbances.  He reports that has changed his diet and has started taking Umzu which he purchased on-line from health food store.  He reports last time he took Metformin it made him feel bad.  He has not checked his sugar in the past and reports he has family members who are diabetic.   Reports he eats a protein shake in the morning then a snack.  He does not eat lunch but will have peanuts and raisins with yogurt.  The he eats baked chicken for dinner.  He has red meat every once in a while.  Denies drinking any sodas or sugary drinks.  Reports drinking green tea with out added sugar.  Reports him and his wife are doing the KETO diet.    His weight goal is 180lbs.  Knows that this will help with his blood sugars, cholesterol and bilateral knees which he reports he will have to have a replacement at some point.  Last hemoglobin A1c was: Lab Results  Component Value Date   HGBA1C 7.3 (H) 02/28/2018   HGBA1C 6.1 (H) 08/09/2017   HGBA1C 5.8 (H) 02/01/2017    Body mass index is 33.99 kg/m.  Pt is not on a prescribed regiment for diabetes at this time.  He has taken metformin in the past And reports it made him feel bad but no specific symptoms. Pt does not checks his sugars to recommend for him to start every morning, fasting Teaching provided for this.Glucometer: Will send Rx for this  Exercise: He does not participate in exercise related to working long hours and pain in both knees.  Reports he has tried swimming and that makes it worse.  Patient does not have CKD He is on ACE/ARB  Lab Results  Component Value Date   GFRAA 93 02/28/2018      Lab Results  Component Value Date   GFRNONAA 80 02/28/2018    Lab Results   Component Value Date   CREATININE 1.04 02/28/2018   BUN 20 02/28/2018   NA 138 02/28/2018   K 4.5 02/28/2018   CL 103 02/28/2018   CO2 27 02/28/2018    Lab Results  Component Value Date   MICROALBUR 0.9 02/28/2018     He is not on a Statin.  He is not at goal of less than 70.  Lab Results  Component Value Date   CHOL 182 02/28/2018   HDL 49 02/28/2018   LDLCALC 114 (H) 02/28/2018   TRIG 87 02/28/2018   CHOLHDL 3.7 02/28/2018     Problem List has Hypertension; Arthritis; Allergic rhinitis; Hyperlipidemia; Prediabetes; Obesity; and Type 2 diabetes mellitus with hyperglycemia (HCC) on their problem list.  Medications Current Outpatient Medications on File Prior to Visit  Medication Sig  . atenolol (TENORMIN) 100 MG tablet Take 1 tablet daily for BP  . celecoxib (CELEBREX) 200 MG capsule Take 1 capsule (200 mg total) 2 (two) times daily by mouth.  . etodolac (LODINE) 500 MG tablet Take 500 mg by mouth 2 (two) times daily.  . Multiple Vitamins-Minerals (MULTIVITAMIN WITH MINERALS) tablet Take 1 tablet by mouth daily.  Marland Kitchen OVER THE COUNTER MEDICATION   . telmisartan (MICARDIS) 40 MG tablet Take 1 tablet daily for BP  . traMADol (ULTRAM) 50 MG tablet TAKE 1 TABLET  BY MOUTH EVERY 6 HOURS AS NEEDED FOR PAIN  . azelastine (ASTELIN) 0.1 % nasal spray Place 2 sprays into both nostrils 2 (two) times daily. Use in each nostril as directed   No current facility-administered medications on file prior to visit.     ROS- see HPI  Physical Exam: Blood pressure 134/90, pulse 61, temperature 98.1 F (36.7 C), height 6' 3.5" (1.918 m), weight 275 lb 9.6 oz (125 kg), SpO2 99 %. Body mass index is 33.99 kg/m. General Appearance: Well nourished, in no apparent distress. Eyes: PERRLA, EOMs, conjunctiva no swelling or erythema ENT/Mouth: Ext aud canals clear, TMs without erythema, bulging. No erythema, swelling, or exudate on post pharynx.  Tonsils not swollen or erythematous. Hearing  normal.  Respiratory: Respiratory effort normal, BS equal bilaterally without rales, rhonchi, wheezing or stridor.  Cardio: RRR with no MRGs. Brisk peripheral pulses without edema.  Abdomen: Soft, + BS.  Non tender, no guarding, rebound, hernias, masses. Musculoskeletal: Full ROM, 5/5 strength, normal gait.  Skin: Warm, dry without rashes, lesions, ecchymosis.  Neuro: Cranial nerves intact. Normal muscle tone, no cerebellar symptoms. Sensation intact.    Plan and Assessment: Esvin was seen today for other.  Diagnoses and all orders for this visit:   Type 2 diabetes mellitus with hyperglycemia, without long-term current use of insulin (HCC) -     metFORMIN (GLUCOPHAGE XR) 500 MG 24 hr tablet; Take 1 tablet by mouth daily with biggest meal for Diabetes -     glucose blood test strip; Use as instructed -     Blood Glucose Monitoring Suppl DEVI; Check blood sugar fasting in am, in the evening and when feeling bad as needed. -     Lancets (ACCU-CHEK SOFT TOUCH) lancets; Use as instructed Discussed importance of eatin three meals a day and healthy snacks.  Diabetes Education: Reviewed 'ABCs' of diabetes management (respective goals in parentheses):  A1C (<7), blood pressure (<130/80), and cholesterol (LDL <70) Eye Exam yearly, due, will call to schedule and Dental Exam every 6 months. Dietary recommendations Physical Activity recommendations - Strongly advised him to start checking sugars at different times of the day - check 2 times a day, rotating checks and record this. - given sugar log and advised how to fill it and to bring it at next appt  - given foot care handout and explained the principles  - given instructions for hypoglycemia management    Essential hypertension Doing well Continue current medications  Mixed hyperlipidemia Discussed the need to add a statin related to LDL and recent diagnosis of DMII.  Really wants to work on diet and exercise to improve this.  Discussed  starting this if remains elevated after follow up in Feb. Patient is agreeable to this plan.  Class 2 severe obesity due to excess calories with serious comorbidity in adult, unspecified BMI ([place])  Discussed at length dietary and exercise modifications Provided educational materials and examples  Follow up in three months for labs and medication management  Call or return with new or worsening symptoms   Future Appointments  Date Time Provider [place]  07/04/2018 10:15 AM Vicie Mutters, PA-C [facility] None  03/20/2019  9:30 AM Vicie Mutters, PA-C [facility] None

## 2018-04-21 ENCOUNTER — Ambulatory Visit (INDEPENDENT_AMBULATORY_CARE_PROVIDER_SITE_OTHER): Payer: 59 | Admitting: Family Medicine

## 2018-04-21 ENCOUNTER — Encounter (INDEPENDENT_AMBULATORY_CARE_PROVIDER_SITE_OTHER): Payer: Self-pay | Admitting: Family Medicine

## 2018-04-21 DIAGNOSIS — M545 Low back pain, unspecified: Secondary | ICD-10-CM

## 2018-04-21 MED ORDER — TRAMADOL HCL 50 MG PO TABS: 50.0000 mg | ORAL_TABLET | Freq: Four times a day (QID) | ORAL | 0 refills | Status: DC | PRN

## 2018-04-21 MED ORDER — CELECOXIB 200 MG PO CAPS: 200.0000 mg | ORAL_CAPSULE | Freq: Two times a day (BID) | ORAL | 3 refills | Status: DC | PRN

## 2018-04-21 MED ORDER — TIZANIDINE HCL 2 MG PO TABS: 2.0000 mg | ORAL_TABLET | Freq: Four times a day (QID) | ORAL | 1 refills | Status: DC | PRN

## 2018-04-21 NOTE — Progress Notes (Signed)
   Office Visit Note   Patient: Dale Tapia           Date of Birth: 02-Aug-1960           MRN: [id number] Visit Date: 04/21/2018 Requested by: Unk Pinto, [address] [place], [place] [id number] PCP: Unk Pinto, MD  Subjective: Chief Complaint  Patient presents with  . Lower Back - Pain    Pain off & on x 3 weeks, mainly right side. Does not radiate down the legs.  Must lift a lot and go up/down stairs much at work.    HPI: He is a 57 year old with right sided lower back pain.  Symptoms started about a week ago, no definite injury but he does a lot of lifting at work and he thinks he might of aggravated that there.  No sciatica symptoms.  It feels better when he is up and moving, worse when he bends in certain positions.  He has had this before and it resolved with a muscle relaxant.  He wonders whether that might be the case again.  He has chronic bilateral knee pain from arthritis, but symptoms are manageable right now.  He is working on losing weight.                ROS: He has hypertension and diabetes, other systems are negative.  Objective: Vital Signs: There were no vitals taken for this visit.  Physical Exam:  Low back: No bony tenderness over the spinous processes.  No pain at the SI joint.  He has muscular tenderness along the quadratus lumborum muscle and this seems to reproduce his pain.  No rash on the skin.  Negative straight leg raise, lower extremity strength and reflexes are normal.  Imaging: None today.  Assessment & Plan: 1.  Right-sided low back pain without sciatica, suspect muscular strain -Medications given, home exercises given.  Physical therapy if symptoms persist.   Follow-Up Instructions: No follow-ups on file.      Procedures: No procedures performed  No notes on file    PMFS History: Patient Active Problem List   Diagnosis Date Noted  . Type 2 diabetes mellitus with hyperglycemia ([place]) 04/11/2018  .  Obesity 05/03/2014  . Prediabetes   . Hypertension   . Arthritis   . Allergic rhinitis   . Hyperlipidemia    Past Medical History:  Diagnosis Date  . Allergic rhinitis, cause unspecified   . Arthritis    neck and knees  . Hyperlipidemia   . Hypertension   . Prediabetes     Family History  Problem Relation Age of Onset  . Colon cancer Neg Hx   . Hypertension Mother   . Arthritis Mother   . Sleep apnea Mother   . Hypertension Father   . Kidney disease Father     History reviewed. No pertinent surgical history. Social History   Occupational History  . Not on file  Tobacco Use  . Smoking status: Never Smoker  . Smokeless tobacco: Never Used  Substance and Sexual Activity  . Alcohol use: No  . Drug use: No  . Sexual activity: Not on file

## 2018-05-07 ENCOUNTER — Other Ambulatory Visit (INDEPENDENT_AMBULATORY_CARE_PROVIDER_SITE_OTHER): Payer: Self-pay | Admitting: Family Medicine

## 2018-05-20 ENCOUNTER — Other Ambulatory Visit (INDEPENDENT_AMBULATORY_CARE_PROVIDER_SITE_OTHER): Payer: Self-pay | Admitting: Family Medicine

## 2018-05-26 ENCOUNTER — Encounter: Payer: Self-pay | Admitting: Adult Health

## 2018-05-26 ENCOUNTER — Ambulatory Visit (HOSPITAL_COMMUNITY)
Admission: RE | Admit: 2018-05-26 | Discharge: 2018-05-26 | Disposition: A | Discharge status: Home / Self Care | Payer: 59 | Source: Ambulatory Visit | Attending: Adult Health | Admitting: Adult Health

## 2018-05-26 ENCOUNTER — Ambulatory Visit (INDEPENDENT_AMBULATORY_CARE_PROVIDER_SITE_OTHER): Payer: 59 | Admitting: Adult Health

## 2018-05-26 VITALS — BP 150/104 | HR 78 | Temp 98.2°F | Ht 75.5 in | Wt 284.0 lb

## 2018-05-26 DIAGNOSIS — R0600 Dyspnea, unspecified: Secondary | ICD-10-CM

## 2018-05-26 DIAGNOSIS — R05 Cough: Secondary | ICD-10-CM | POA: Diagnosis not present

## 2018-05-26 DIAGNOSIS — R6889 Other general symptoms and signs: Secondary | ICD-10-CM | POA: Diagnosis not present

## 2018-05-26 DIAGNOSIS — R059 Cough, unspecified: Secondary | ICD-10-CM

## 2018-05-26 DIAGNOSIS — R0602 Shortness of breath: Secondary | ICD-10-CM | POA: Diagnosis not present

## 2018-05-26 LAB — POC INFLUENZA A&B (BINAX/QUICKVUE)

## 2018-05-26 MED ORDER — PREDNISONE 20 MG PO TABS: ORAL_TABLET | ORAL | 0 refills | Status: DC

## 2018-05-26 MED ORDER — PROMETHAZINE-DM 6.25-15 MG/5ML PO SYRP: 5.0000 mL | ORAL_SOLUTION | Freq: Four times a day (QID) | ORAL | 1 refills | Status: DC | PRN

## 2018-05-26 NOTE — Progress Notes (Signed)
Assessment and Plan:  Dale Tapia was seen today for acute visit.  Diagnoses and all orders for this visit:  Flu-like symptoms/fever/cough Flu swab negative for A/B, but suspect viral illness, check CXR to r/o pneumonia, check basic labs for electrolytes, dehydration Suggested symptomatic OTC remedies, immune support Push fluids - likely dehydrated - alternate water and electrolyte supplemented drink if not eating Tylenol for fever/headaches Present to ER if fever uncontrolled above 101 despite medication, dyspnea, CP, new or worsening symptoms Stay out for work until 24-48 hours after fever resolves - expect 5-7 day duration Follow up as needed -     POC Influenza A&B(BINAX/QUICKVUE) -     DG Chest 2 View; Future -     CBC with Differential/Platelet -     COMPLETE METABOLIC PANEL WITH GFR -     predniSONE (DELTASONE) 20 MG tablet; 2 tablets daily for 3 days, 1 tablet daily for 4 days. -     promethazine-dextromethorphan (PROMETHAZINE-DM) 6.25-15 MG/5ML syrup; Take 5 mLs by mouth 4 (four) times daily as needed for cough.  Further disposition pending results of labs. Discussed med's effects and SE's.   Over 30 minutes of exam, counseling, chart review, and critical decision making was performed.   Future Appointments  Date Time Provider [place]  07/04/2018 10:15 AM Vicie Mutters, PA-C [facility] None  03/20/2019  9:30 AM Vicie Mutters, PA-C [facility] None    ------------------------------------------------------------------------------------------------------------------   HPI BP (!) 150/104   Pulse 78   Temp 98.2 F (36.8 C)   Ht 6' 3.5" (1.918 m)   Wt 284 lb (128.8 kg)   SpO2 94%   BMI 35.03 kg/m   57 y.o.male with T2DM (well controlled on metformin), htn presents for evaluation of fever that began 3 days ago, woke up feeling feverish, body aches, fever has been running 101-102, 99.7 this AM, has been taking tylenol for fever. Also endorses loose stools,  bilateral ear pressure, feeling somewhat dizzy. He denies abd pain, n/v, constipation, sore throat. He endorses mild non-productive cough, some nasal congestion, mild intermittent headache. He denies chest pains/aches, wheezy, but does endorse very mild dyspnea with exertion.   He did not have the flu vaccine, his wife and daughter have also had URI type symptoms without fever.   Past Medical History:  Diagnosis Date  . Allergic rhinitis, cause unspecified   . Arthritis    neck and knees  . Hyperlipidemia   . Hypertension   . Prediabetes      No Known Allergies  Current Outpatient Medications on File Prior to Visit  Medication Sig  . atenolol (TENORMIN) 100 MG tablet Take 1 tablet daily for BP  . Blood Glucose Monitoring Suppl DEVI Check blood sugar fasting in am, in the evening and when feeling bad as needed.  . celecoxib (CELEBREX) 200 MG capsule Take 1 capsule (200 mg total) by mouth 2 (two) times daily as needed.  . etodolac (LODINE) 500 MG tablet Take 500 mg by mouth 2 (two) times daily.  Marland Kitchen glucose blood test strip Use as instructed  . Lancets (ACCU-CHEK SOFT TOUCH) lancets Use as instructed  . metFORMIN (GLUCOPHAGE XR) 500 MG 24 hr tablet Take 1 tablet by mouth daily with biggest meal for Diabetes  . Multiple Vitamins-Minerals (MULTIVITAMIN WITH MINERALS) tablet Take 1 tablet by mouth daily.  Marland Kitchen telmisartan (MICARDIS) 40 MG tablet Take 1 tablet daily for BP  . tiZANidine (ZANAFLEX) 4 MG tablet Take 1/2 to 1 tablet (2-4 mg total) by mouth every  6 (six) hours as needed for muscle spasms.  . traMADol (ULTRAM) 50 MG tablet Take 1 tablet (50 mg total) by mouth every 6 (six) hours as needed. for pain  . azelastine (ASTELIN) 0.1 % nasal spray Place 2 sprays into both nostrils 2 (two) times daily. Use in each nostril as directed (Patient not taking: Reported on 05/26/2018)  . OVER THE COUNTER MEDICATION    No current facility-administered medications on file prior to visit.     ROS: all  negative except above.   Physical Exam:  BP (!) 150/104   Pulse 78   Temp 98.2 F (36.8 C)   Ht 6' 3.5" (1.918 m)   Wt 284 lb (128.8 kg)   SpO2 94%   BMI 35.03 kg/m   General Appearance: Well nourished, in no acute distress. Eyes: PERRLA, EOMs, conjunctiva no swelling or erythema Sinuses: No Frontal/maxillary tenderness ENT/Mouth: Ext aud canals clear, TMs without erythema, bulging. No erythema, swelling, or exudate on post pharynx.  Tonsils not swollen or erythematous. Hearing normal.  Neck: Supple, thyroid normal.  Respiratory: Respiratory effort normal, BS equal bilaterally without rales, rhonchi, wheezing or stridor.  Cardio: RRR with no MRGs. Brisk peripheral pulses without edema.  Abdomen: Soft, + BS.  Non tender, no guarding, rebound, hernias, masses. Lymphatics: Non tender without lymphadenopathy.  Musculoskeletal: Symmetrical strength, normal gait.  Skin: Warm, dry without rashes, lesions, ecchymosis.  Neuro: Cranial nerves intact. Normal muscle tone, no cerebellar symptoms. Sensation intact.  Psych: Awake and oriented X 3, normal affect, Insight and Judgment appropriate.     Izora Ribas, NP 10:08 AM [facility]

## 2018-05-26 NOTE — Patient Instructions (Signed)
HOW TO TREAT VIRAL COUGH AND COLD SYMPTOMS:  -Symptoms usually last at least 1 week with the worst symptoms being around day 4.  - colds usually start with a sore throat and end with a cough, and the cough can take 2 weeks to get better.  -No antibiotics are needed for colds, flu, sore throats, cough, bronchitis UNLESS symptoms are longer than 7 days OR if you are getting better then get drastically worse.  -There are a lot of combination medications (Dayquil, Nyquil, Vicks 44, tyelnol cold and sinus, ETC). Please look at the ingredients on the back so that you are treating the correct symptoms and not doubling up on medications/ingredients.    Medicines you can use  Nasal congestion  Little Remedies saline spray (aerosol/mist)- can try this, it is in the kids section - pseudoephedrine (Sudafed)- behind the counter, do not use if you have high blood pressure, medicine that have -D in them.  - phenylephrine (Sudafed PE) -Dextormethorphan + chlorpheniramine (Coridcidin HBP)- okay if you have high blood pressure -Oxymetazoline (Afrin) nasal spray- LIMIT to 3 days -Saline nasal spray -Neti pot (used distilled or bottled water)  Ear pain/congestion  -pseudoephedrine (sudafed) - Nasonex/flonase nasal spray  Fever  -Acetaminophen (Tyelnol) -Ibuprofen (Advil, motrin, aleve)  Sore Throat  -Acetaminophen (Tyelnol) -Ibuprofen (Advil, motrin, aleve) -Drink a lot of water -Gargle with salt water - Rest your voice (don't talk) -Throat sprays -Cough drops  Body Aches  -Acetaminophen (Tyelnol) -Ibuprofen (Advil, motrin, aleve)  Headache  -Acetaminophen (Tyelnol) -Ibuprofen (Advil, motrin, aleve) - Exedrin, Exedrin Migraine  Allergy symptoms (cough, sneeze, runny nose, itchy eyes) -Claritin or loratadine cheapest but likely the weakest  -Zyrtec or certizine at night because it can make you sleepy -The strongest is allegra or fexafinadine  Cheapest at [employer], [employer],  [employer]  Cough  -Dextromethorphan (Delsym)- medicine that has DM in it -Guafenesin (Mucinex/Robitussin) - cough drops - drink lots of water  Chest Congestion  -Guafenesin (Mucinex/Robitussin)  Red Itchy Eyes  - Naphcon-A  Upset Stomach  - Bland diet (nothing spicy, greasy, fried, and high acid foods like tomatoes, oranges, berries) -OKAY- cereal, bread, soup, crackers, rice -Eat smaller more frequent meals -reduce caffeine, no alcohol -Loperamide (Imodium-AD) if diarrhea -Prevacid for heart burn  General health when sick  -Hydration -wash your hands frequently -keep surfaces clean -change pillow cases and sheets often -Get fresh air but do not exercise strenuously -Vitamin D, double up on it - Vitamin C -Zinc   Influenza, Adult Influenza, more commonly known as "the flu," is a viral infection that mainly affects the respiratory tract. The respiratory tract includes organs that help you breathe, such as the lungs, nose, and throat. The flu causes many symptoms similar to the common cold along with high fever and body aches. The flu spreads easily from person to person (is contagious). Getting a flu shot (influenza vaccination) every year is the best way to prevent the flu. What are the causes? This condition is caused by the influenza virus. You can get the virus by:  Breathing in droplets that are in the air from an infected person's cough or sneeze.  Touching something that has been exposed to the virus (has been contaminated) and then touching your mouth, nose, or eyes. What increases the risk? The following factors may make you more likely to get the flu:  Not washing or sanitizing your hands often.  Having close contact with many people during cold and flu season.  Touching your mouth, eyes,  or nose without first washing or sanitizing your hands.  Not getting a yearly (annual) flu shot. You may have a higher risk for the flu, including serious problems such as a  lung infection (pneumonia), if you:  Are older than 65.  Are pregnant.  Have a weakened disease-fighting system (immune system). You may have a weakened immune system if you: ? Have HIV or AIDS. ? Are undergoing chemotherapy. ? Are taking medicines that reduce (suppress) the activity of your immune system.  Have a long-term (chronic) illness, such as heart disease, kidney disease, diabetes, or lung disease.  Have a liver disorder.  Are severely overweight (morbidly obese).  Have anemia. This is a condition that affects your red blood cells.  Have asthma. What are the signs or symptoms? Symptoms of this condition usually begin suddenly and last 4-14 days. They may include:  Fever and chills.  Headaches, body aches, or muscle aches.  Sore throat.  Cough.  Runny or stuffy (congested) nose.  Chest discomfort.  Poor appetite.  Weakness or fatigue.  Dizziness.  Nausea or vomiting. How is this diagnosed? This condition may be diagnosed based on:  Your symptoms and medical history.  A physical exam.  Swabbing your nose or throat and testing the fluid for the influenza virus. How is this treated? If the flu is diagnosed early, you can be treated with medicine that can help reduce how severe the illness is and how long it lasts (antiviral medicine). This may be given by mouth (orally) or through an IV. Taking care of yourself at home can help relieve symptoms. Your health care provider may recommend:  Taking over-the-counter medicines.  Drinking plenty of fluids. In many cases, the flu goes away on its own. If you have severe symptoms or complications, you may be treated in a hospital. Follow these instructions at home: Activity  Rest as needed and get plenty of sleep.  Stay home from work or school as told by your health care provider. Unless you are visiting your health care provider, avoid leaving home until your fever has been gone for 24 hours without taking  medicine. Eating and drinking  Take an oral rehydration solution (ORS). This is a drink that is sold at pharmacies and retail stores.  Drink enough fluid to keep your urine pale yellow.  Drink clear fluids in small amounts as you are able. Clear fluids include water, ice chips, diluted fruit juice, and low-calorie sports drinks.  Eat bland, easy-to-digest foods in small amounts as you are able. These foods include bananas, applesauce, rice, lean meats, toast, and crackers.  Avoid drinking fluids that contain a lot of sugar or caffeine, such as energy drinks, regular sports drinks, and soda.  Avoid alcohol.  Avoid spicy or fatty foods. General instructions      Take over-the-counter and prescription medicines only as told by your health care provider.  Use a cool mist humidifier to add humidity to the air in your home. This can make it easier to breathe.  Cover your mouth and nose when you cough or sneeze.  Wash your hands with soap and water often, especially after you cough or sneeze. If soap and water are not available, use alcohol-based hand sanitizer.  Keep all follow-up visits as told by your health care provider. This is important. How is this prevented?   Get an annual flu shot. You may get the flu shot in late summer, fall, or winter. Ask your health care provider when you should  get your flu shot.  Avoid contact with people who are sick during cold and flu season. This is generally fall and winter. Contact a health care provider if:  You develop new symptoms.  You have: ? Chest pain. ? Diarrhea. ? A fever.  Your cough gets worse.  You produce more mucus.  You feel nauseous or you vomit. Get help right away if:  You develop shortness of breath or difficulty breathing.  Your skin or nails turn a bluish color.  You have severe pain or stiffness in your neck.  You develop a sudden headache or sudden pain in your face or ear.  You cannot eat or drink  without vomiting. Summary  Influenza, more commonly known as "the flu," is a viral infection that primarily affects your respiratory tract.  Symptoms of the flu usually begin suddenly and last 4-14 days.  Getting an annual flu shot is the best way to prevent getting the flu.  Stay home from work or school as told by your health care provider. Unless you are visiting your health care provider, avoid leaving home until your fever has been gone for 24 hours without taking medicine.  Keep all follow-up visits as told by your health care provider. This is important. This information is not intended to replace advice given to you by your health care provider. Make sure you discuss any questions you have with your health care provider. Document Released: 05/04/2000 Document Revised: 10/23/2017 Document Reviewed: 10/23/2017 Elsevier Interactive Patient Education  2019 [employer].

## 2018-05-27 LAB — COMPLETE METABOLIC PANEL WITH GFR
AG Ratio: 1.8 (calc) (ref 1.0–2.5)
ALT: 33 U/L (ref 9–46)
AST: 33 U/L (ref 10–35)
Albumin: 4.1 g/dL (ref 3.6–5.1)
Alkaline phosphatase (APISO): 77 U/L (ref 40–115)
BUN: 13 mg/dL (ref 7–25)
CO2: 28 mmol/L (ref 20–32)
Calcium: 8.6 mg/dL (ref 8.6–10.3)
Chloride: 101 mmol/L (ref 98–110)
Creat: 1.07 mg/dL (ref 0.70–1.33)
GFR, Est African American: 90 mL/min/{1.73_m2} (ref >=60)
GFR, Est Non African American: 78 mL/min/{1.73_m2} (ref >=60)
Globulin: 2.3 g/dL (calc) (ref 1.9–3.7)
Glucose, Bld: 168 mg/dL — ABNORMAL HIGH (ref 65–99)
Potassium: 3.8 mmol/L (ref 3.5–5.3)
SODIUM: 137 mmol/L (ref 135–146)
Total Bilirubin: 0.9 mg/dL (ref 0.2–1.2)
Total Protein: 6.4 g/dL (ref 6.1–8.1)

## 2018-05-27 LAB — CBC WITH DIFFERENTIAL/PLATELET
Absolute Monocytes: 654 cells/uL (ref 200–950)
Basophils Absolute: 11 cells/uL (ref 0–200)
Basophils Relative: 0.3 %
EOS PCT: 0 %
Eosinophils Absolute: 0 cells/uL — ABNORMAL LOW (ref 15–500)
HCT: 44.4 % (ref 38.5–50.0)
Hemoglobin: 15 g/dL (ref 13.2–17.1)
Lymphs Abs: 927 cells/uL (ref 850–3900)
MCH: 30.8 pg (ref 27.0–33.0)
MCHC: 33.8 g/dL (ref 32.0–36.0)
MCV: 91.2 fL (ref 80.0–100.0)
MONOS PCT: 17.2 %
MPV: 12.1 fL (ref 7.5–12.5)
NEUTROS PCT: 58.1 %
Neutro Abs: 2208 cells/uL (ref 1500–7800)
PLATELETS: 109 10*3/uL — AB (ref 140–400)
RBC: 4.87 10*6/uL (ref 4.20–5.80)
RDW: 11.9 % (ref 11.0–15.0)
Total Lymphocyte: 24.4 %
WBC: 3.8 10*3/uL (ref 3.8–10.8)

## 2018-07-04 ENCOUNTER — Ambulatory Visit: Payer: Self-pay | Admitting: Physician Assistant

## 2018-07-10 ENCOUNTER — Other Ambulatory Visit: Payer: Self-pay | Admitting: Internal Medicine

## 2018-07-10 MED ORDER — PROMETHAZINE-DM 6.25-15 MG/5ML PO SYRP: ORAL_SOLUTION | ORAL | 2 refills | Status: DC

## 2018-09-26 ENCOUNTER — Other Ambulatory Visit: Payer: Self-pay | Admitting: Internal Medicine

## 2018-09-26 DIAGNOSIS — I10 Essential (primary) hypertension: Secondary | ICD-10-CM

## 2018-09-26 MED ORDER — TELMISARTAN 40 MG PO TABS: ORAL_TABLET | ORAL | 1 refills | Status: DC

## 2018-09-26 MED ORDER — ATENOLOL 100 MG PO TABS: ORAL_TABLET | ORAL | 1 refills | Status: DC

## 2018-09-29 ENCOUNTER — Other Ambulatory Visit: Payer: Self-pay | Admitting: Family Medicine

## 2018-09-29 MED ORDER — CELECOXIB 200 MG PO CAPS: 200.0000 mg | ORAL_CAPSULE | Freq: Two times a day (BID) | ORAL | 1 refills | Status: DC | PRN

## 2018-10-08 ENCOUNTER — Other Ambulatory Visit: Payer: Self-pay | Admitting: Family Medicine

## 2018-10-08 ENCOUNTER — Telehealth: Payer: Self-pay | Admitting: Family Medicine

## 2018-10-08 MED ORDER — TRAMADOL HCL 50 MG PO TABS: 50.0000 mg | ORAL_TABLET | Freq: Four times a day (QID) | ORAL | 0 refills | Status: DC | PRN

## 2018-10-08 NOTE — Telephone Encounter (Signed)
Taya-pharmacist from [facility] called needing auth in order to fill Rx for (Tramadol) The number to contact Taya is [phone]

## 2018-10-08 NOTE — Telephone Encounter (Signed)
This was sent in to the pharmacy electronically by Dr. Junius Roads this afternoon (after this phone call).

## 2018-11-12 ENCOUNTER — Other Ambulatory Visit: Payer: Self-pay

## 2018-11-12 ENCOUNTER — Ambulatory Visit: Payer: Self-pay

## 2018-11-12 ENCOUNTER — Encounter: Payer: Self-pay | Admitting: Orthopaedic Surgery

## 2018-11-12 ENCOUNTER — Other Ambulatory Visit: Payer: Self-pay | Admitting: Family Medicine

## 2018-11-12 ENCOUNTER — Ambulatory Visit (INDEPENDENT_AMBULATORY_CARE_PROVIDER_SITE_OTHER): Payer: 59 | Admitting: Orthopaedic Surgery

## 2018-11-12 DIAGNOSIS — M170 Bilateral primary osteoarthritis of knee: Secondary | ICD-10-CM

## 2018-11-12 DIAGNOSIS — M19011 Primary osteoarthritis, right shoulder: Secondary | ICD-10-CM | POA: Diagnosis not present

## 2018-11-12 MED ORDER — BUPIVACAINE HCL 0.5 % IJ SOLN
2.0000 mL | INTRAMUSCULAR | Status: AC | PRN
  Administered 2018-11-12: 2 mL via INTRA_ARTICULAR

## 2018-11-12 MED ORDER — TRAMADOL HCL 50 MG PO TABS: 50.0000 mg | ORAL_TABLET | Freq: Four times a day (QID) | ORAL | 0 refills | Status: DC | PRN

## 2018-11-12 MED ORDER — LIDOCAINE HCL 1 % IJ SOLN
2.0000 mL | INTRAMUSCULAR | Status: AC | PRN
  Administered 2018-11-12: 2 mL

## 2018-11-12 MED ORDER — METHYLPREDNISOLONE ACETATE 40 MG/ML IJ SUSP
40.0000 mg | INTRAMUSCULAR | Status: AC | PRN
  Administered 2018-11-12: 40 mg via INTRA_ARTICULAR

## 2018-11-12 NOTE — Progress Notes (Signed)
Office Visit Note   Patient: Dale Tapia           Date of Birth: 25-Feb-1961           MRN: [id number] Visit Date: 11/12/2018              Requested by: Unk Pinto, [address] [place],  [place] [address] PCP: Unk Pinto, MD   Assessment & Plan: Visit Diagnoses:  1. Primary osteoarthritis of both knees   2. Arthrosis of right acromioclavicular joint     Plan: Impression is bilateral knee osteoarthritis and degenerative joint disease with bone-on-bone medial joint space narrowing.  He continues to get good relief from cortisone injections therefore we will repeat them today.  He is prediabetic so I did give him a pamphlet for viscosupplementation.  In terms of the shoulder reassurance was provided.  His rotator cuff is completely intact.  His AC joint does not bother him.  He is not exhibiting any impingement signs.  We will see him back as needed.  Questions encouraged and answered.  Follow-Up Instructions: Return if symptoms worsen or fail to improve.   Orders:  Orders Placed This Encounter  Procedures  . XR Shoulder Right  . XR KNEE 3 VIEW LEFT  . XR KNEE 3 VIEW RIGHT   No orders of the defined types were placed in this encounter.     Procedures: Large Joint Inj: bilateral knee on 11/12/2018 3:50 PM Indications: pain Details: 22 G needle  Arthrogram: No  Medications (Right): 2 mL lidocaine 1 %; 2 mL bupivacaine 0.5 %; 40 mg methylPREDNISolone acetate 40 MG/ML Medications (Left): 2 mL lidocaine 1 %; 2 mL bupivacaine 0.5 %; 40 mg methylPREDNISolone acetate 40 MG/ML Outcome: tolerated well, no immediate complications Patient was prepped and draped in the usual sterile fashion.       Clinical Data: No additional findings.   Subjective: Chief Complaint  Patient presents with  . Right Shoulder - Pain  . Right Knee - Pain  . Left Knee - Pain    Dale Tapia is a 58 year old gentleman who has a brother Dale Tapia who comes in for  follow-up of bilateral knee pain and not overlying his right shoulder.  In terms of his knees I have seen in the past for this and he has had good relief from cortisone injections which she is requesting today.  He is still getting along for quite well and not interested in knee replacement surgery.  In terms of his right shoulder he states that he mainly wants reassurance and get it checked out to see what the bump is.  The bump is overlying his AC joint.  Denies any injuries.  He denies any pain in this area.   Review of Systems  Constitutional: Negative.   All other systems reviewed and are negative.    Objective: Vital Signs: There were no vitals taken for this visit.  Physical Exam Vitals signs and nursing note reviewed.  Constitutional:      Appearance: He is well-developed.  HENT:     Head: Normocephalic and atraumatic.  Eyes:     Pupils: Pupils are equal, round, and reactive to light.  Neck:     Musculoskeletal: Neck supple.  Pulmonary:     Effort: Pulmonary effort is normal.  Abdominal:     Palpations: Abdomen is soft.  Musculoskeletal: Normal range of motion.  Skin:    General: Skin is warm.  Neurological:     Mental  Status: He is alert and oriented to person, place, and time.  Psychiatric:        Behavior: Behavior normal.        Thought Content: Thought content normal.        Judgment: Judgment normal.     Ortho Exam Bilateral knee exam are stable and unchanged.  No joint effusion. Right shoulder exam shows a prominent distal clavicle to palpation.  This is nontender to palpation.  Negative cross adduction body sign. manual muscle testing is normal. Specialty Comments:  No specialty comments available.  Imaging: Xr Knee 3 View Left  Result Date: 11/12/2018 Severe medial compartment joint space narrowing with tricompartmental degenerative joint disease  Xr Knee 3 View Right  Result Date: 11/12/2018 Severe medial compartment joint space narrowing with  tricompartmental degenerative joint disease.  Xr Shoulder Right  Result Date: 11/12/2018 Prominent distal clavicle.  No other findings.    PMFS History: Patient Active Problem List   Diagnosis Date Noted  . Primary osteoarthritis of both knees 11/12/2018  . Arthrosis of right acromioclavicular joint 11/12/2018  . Type 2 diabetes mellitus with hyperglycemia ([place]) 04/11/2018  . Obesity 05/03/2014  . Prediabetes   . Hypertension   . Arthritis   . Allergic rhinitis   . Hyperlipidemia    Past Medical History:  Diagnosis Date  . Allergic rhinitis, cause unspecified   . Arthritis    neck and knees  . Hyperlipidemia   . Hypertension   . Prediabetes     Family History  Problem Relation Age of Onset  . Colon cancer Neg Hx   . Hypertension Mother   . Arthritis Mother   . Sleep apnea Mother   . Hypertension Father   . Kidney disease Father     History reviewed. No pertinent surgical history. Social History   Occupational History  . Not on file  Tobacco Use  . Smoking status: Never Smoker  . Smokeless tobacco: Never Used  Substance and Sexual Activity  . Alcohol use: No  . Drug use: No  . Sexual activity: Not on file

## 2019-01-20 ENCOUNTER — Ambulatory Visit (INDEPENDENT_AMBULATORY_CARE_PROVIDER_SITE_OTHER): Payer: BC Managed Care – PPO | Admitting: Adult Health Nurse Practitioner

## 2019-01-20 ENCOUNTER — Other Ambulatory Visit: Payer: Self-pay

## 2019-01-20 ENCOUNTER — Encounter: Payer: Self-pay | Admitting: Adult Health Nurse Practitioner

## 2019-01-20 VITALS — BP 128/82 | HR 67 | Temp 97.9°F | Wt 273.0 lb

## 2019-01-20 DIAGNOSIS — H6502 Acute serous otitis media, left ear: Secondary | ICD-10-CM | POA: Diagnosis not present

## 2019-01-20 DIAGNOSIS — R0981 Nasal congestion: Secondary | ICD-10-CM | POA: Diagnosis not present

## 2019-01-20 DIAGNOSIS — I10 Essential (primary) hypertension: Secondary | ICD-10-CM

## 2019-01-20 DIAGNOSIS — E1165 Type 2 diabetes mellitus with hyperglycemia: Secondary | ICD-10-CM

## 2019-01-20 DIAGNOSIS — R252 Cramp and spasm: Secondary | ICD-10-CM | POA: Diagnosis not present

## 2019-01-20 DIAGNOSIS — J302 Other seasonal allergic rhinitis: Secondary | ICD-10-CM

## 2019-01-20 NOTE — Patient Instructions (Addendum)
   Use the Neti Pot twice a day Use warm distilled or bottled water  Allergy Symptoms / Runny Nose: Chose one  Zyrtec / Cetirizine Take 10mg  by mouth May cause drowsiness, take nightly Be sure to drink plenty of water If this is not effective, try Xyzal or Allegra  OR  Xyzal / Levocetirazine  Take 5mg  by mouth May cause drowsiness, take nightly Be sure to drink plenty of water If this is not effective try Allegra or Zyrtec  OR  Allegra / fexofenadine Take 180mg  by mouth daily If this is not effective try Zyrtec or Xyzal   *If you battle with chronic allergies you may need to change the antihistamine you currently use to find most effective.   ________________________________________________________________________  Check your blood pressure when you get home from work.  Goal for you is 130/80.    120/80 is perfect  If you blood pressure is 100's / 60's  We may need to consider adjusting (decreasing) your blood pressure medications.  Please let us know if this occurs.  Stay well hydrated.  Try Gatorade Zero, that has no sugar.  Check you blood glucose at work when you feel bad.

## 2019-01-20 NOTE — Progress Notes (Signed)
Assessment and Plan:  Dale Tapia was seen today for sinus drainage.  Diagnoses and all orders for this visit:  Seasonal allergic rhinitis, unspecified trigger Discussed using antihistamine, zyrtec nightly  Nasal sinus congestion Discussed using NetiPot BID and saline PRN If not improved start Flonase daily  Non-recurrent acute serous otitis media of left ear Continue to monitor for symptoms Zyrtec Cause of dizziness?  Muscle cramps Dehydration? Continue Magnesium supplementation Continue to increase water intake Discussed Gatorade Zero  Dizziness Discussed monitoring blood pressure in evening for hypotension Check blood glucose when feeling bad at work Acute serous otitis?  Essential hypertension Continue Atenolol 100mg  at night and Telmisartan 40mg  daily  Type 2 diabetes mellitus with hyperglycemia, without long-term current use of insulin (HCC) Continue to monitor blood glucose Check when feeling bad Will check A1c next visit Continue dietary and exercise modifications with weight loss to improve A1c, last 7.3%   Patient agrees with plan of care.  Discussed when to call or return.   Further disposition pending results of labs. Discussed med's effects and SE's.   Over 30 minutes of exam, counseling, chart review, and critical decision making was performed.   Future Appointments  Date Time Provider [place]  03/20/2019  9:30 AM Vicie Mutters, PA-C [id number] None    ------------------------------------------------------------------------------------------------------------------   HPI 58 y.o.male presents for  Reports that his head and nose is clogged up and trying to drain.  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  Reports that he has a cough that is not new, but intermittent and worse when allergies bother him.  He is taking Alloplex an all natural to help with his symptoms. He reports intermittent headache that tylenol  helps to resolve. Denies any sore throat or ear popping, productive cough.  He does not use any nasal spray or any other OTC products. He also reports he has been having muscle cramps in his arms and legs.  After last visit he has been taking magnesium 500mg , two tablets in morning and two tablets in the evening.  He reports this is helping some.  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 He report he sweats profusely during his work day.  He has been experiencing some dizziness.  He reports that he refills his water jug 5-6 times during a work shift, unsure of number of ounces, 64?  He is taking blood pressure medications atenolol 100mg  at night and telmisartatin 40mg  in the morning.  He checks his blood glucose once a day, fasting and reports ranges of 180-240?  He does not check this during the day or when he feels bad.  Past Medical History:  Diagnosis Date  . Allergic rhinitis, cause unspecified   . Arthritis    neck and knees  . Hyperlipidemia   . Hypertension   . Prediabetes      No Known Allergies  Current Outpatient Medications on File Prior to Visit  Medication Sig  . atenolol (TENORMIN) 100 MG tablet Take 1 tablet daily for BP  . Blood Glucose Monitoring Suppl DEVI Check blood sugar fasting in am, in the evening and when feeling bad as needed.  . celecoxib (CELEBREX) 200 MG capsule Take 1 capsule (200 mg total) by mouth 2 (two) times daily as needed.  Marland Kitchen glucose blood test strip Use as instructed  . Lancets (ACCU-CHEK SOFT TOUCH) lancets Use as  instructed  . metFORMIN (GLUCOPHAGE XR) 500 MG 24 hr tablet Take 1 tablet by mouth daily with biggest meal for Diabetes  . Multiple Vitamins-Minerals (MULTIVITAMIN WITH MINERALS) tablet Take 1 tablet by mouth daily.  Marland Kitchen OVER THE COUNTER MEDICATION   . telmisartan (MICARDIS) 40 MG tablet Take 1 tablet daily for BP  . tiZANidine (ZANAFLEX) 4 MG tablet  Take 1/2 to 1 tablet (2-4 mg total) by mouth every 6 (six) hours as needed for muscle spasms.  . traMADol (ULTRAM) 50 MG tablet Take 1 tablet (50 mg total) by mouth every 6 (six) hours as needed. for pain   No current facility-administered medications on file prior to visit.     ROS: all negative except above.   Physical Exam:  BP 128/82   Pulse 67   Temp 97.9 F (36.6 C)   Wt 273 lb (123.8 kg)   SpO2 97%   BMI 33.67 kg/m   General Appearance: Well nourished, in no apparent distress. Eyes: PERRLA, EOMs, conjunctiva no swelling or erythema Sinuses: No Frontal/maxillary tenderness.  Nasal canal, erythema bilaterally. ENT/Mouth: Ext aud canals clear, TMs without erythema, bulging. No erythema, swelling, or exudate on post pharynx.  Tonsils not swollen or erythematous. Hearing normal.  Neck: Supple, thyroid normal.  Respiratory: Respiratory effort normal, BS equal bilaterally without rales, rhonchi, wheezing or stridor.  Cardio: RRR with no MRGs. Brisk peripheral pulses without edema.  Abdomen: Soft, + BS.  Non tender, no guarding, rebound, hernias, masses. Lymphatics: Non tender without lymphadenopathy.  Musculoskeletal: Full ROM, 5/5 strength, normal gait.  Skin: Warm, dry without rashes, lesions, ecchymosis.  Neuro: Cranial nerves intact. Normal muscle tone, no cerebellar symptoms. Sensation intact.  Psych: Awake and oriented X 3, normal affect, Insight and Judgment appropriate.     Garnet Sierras, NP 1:52 PM [facility]

## 2019-01-28 ENCOUNTER — Other Ambulatory Visit: Payer: Self-pay

## 2019-01-28 ENCOUNTER — Encounter (HOSPITAL_BASED_OUTPATIENT_CLINIC_OR_DEPARTMENT_OTHER): Payer: Self-pay | Admitting: *Deleted

## 2019-01-28 ENCOUNTER — Emergency Department (HOSPITAL_BASED_OUTPATIENT_CLINIC_OR_DEPARTMENT_OTHER)
Admission: EM | Admit: 2019-01-28 | Discharge: 2019-01-28 | Disposition: A | Discharge status: Home / Self Care | Payer: No Typology Code available for payment source | Attending: Emergency Medicine | Admitting: Emergency Medicine

## 2019-01-28 DIAGNOSIS — S0101XA Laceration without foreign body of scalp, initial encounter: Secondary | ICD-10-CM | POA: Diagnosis not present

## 2019-01-28 DIAGNOSIS — Z79899 Other long term (current) drug therapy: Secondary | ICD-10-CM | POA: Insufficient documentation

## 2019-01-28 DIAGNOSIS — Y999 Unspecified external cause status: Secondary | ICD-10-CM | POA: Diagnosis not present

## 2019-01-28 DIAGNOSIS — Z7984 Long term (current) use of oral hypoglycemic drugs: Secondary | ICD-10-CM | POA: Insufficient documentation

## 2019-01-28 DIAGNOSIS — Y939 Activity, unspecified: Secondary | ICD-10-CM | POA: Insufficient documentation

## 2019-01-28 DIAGNOSIS — I10 Essential (primary) hypertension: Secondary | ICD-10-CM | POA: Insufficient documentation

## 2019-01-28 DIAGNOSIS — E119 Type 2 diabetes mellitus without complications: Secondary | ICD-10-CM | POA: Insufficient documentation

## 2019-01-28 DIAGNOSIS — W228XXA Striking against or struck by other objects, initial encounter: Secondary | ICD-10-CM | POA: Diagnosis not present

## 2019-01-28 DIAGNOSIS — Z23 Encounter for immunization: Secondary | ICD-10-CM | POA: Insufficient documentation

## 2019-01-28 DIAGNOSIS — Y929 Unspecified place or not applicable: Secondary | ICD-10-CM | POA: Diagnosis not present

## 2019-01-28 MED ORDER — TETANUS-DIPHTH-ACELL PERTUSSIS 5-2.5-18.5 LF-MCG/0.5 IM SUSP
0.5000 mL | Freq: Once | INTRAMUSCULAR | Status: AC
  Administered 2019-01-28: 0.5 mL via INTRAMUSCULAR
  Filled 2019-01-28: qty 0.5

## 2019-01-28 NOTE — ED Triage Notes (Signed)
Pt c/o lac to right side of head , NO LOC

## 2019-01-28 NOTE — ED Provider Notes (Addendum)
[facility]ERGENCY DEPARTMENT Provider Note   CSN: [id number] Arrival date & time: 01/28/19  1527     History   Chief Complaint Chief Complaint  Patient presents with  . Head Laceration    HPI Dale Tapia is a 58 y.o. male.     Patient hit the right side of his head on a metal object prior to arrival.  Has laceration to the scalp that is hemostatic.  Did not lose consciousness.  Patient not on blood thinners.  No other injuries.  The history is provided by the patient.  Head Laceration This is a new problem. The current episode started less than 1 hour ago. The problem occurs constantly. The problem has not changed since onset.Pertinent negatives include no chest pain, no abdominal pain, no headaches and no shortness of breath. Nothing aggravates the symptoms. Nothing relieves the symptoms. He has tried nothing for the symptoms. The treatment provided no relief.    Past Medical History:  Diagnosis Date  . Allergic rhinitis, cause unspecified   . Arthritis    neck and knees  . Hyperlipidemia   . Hypertension   . Prediabetes     Patient Active Problem List   Diagnosis Date Noted  . Primary osteoarthritis of both knees 11/12/2018  . Arthrosis of right acromioclavicular joint 11/12/2018  . Type 2 diabetes mellitus with hyperglycemia ([place]) 04/11/2018  . Obesity 05/03/2014  . Prediabetes   . Hypertension   . Arthritis   . Allergic rhinitis   . Hyperlipidemia     History reviewed. No pertinent surgical history.      Home Medications    Prior to Admission medications   Medication Sig Start Date End Date Taking? Authorizing Provider  atenolol (TENORMIN) 100 MG tablet Take 1 tablet daily for BP 09/26/18   Unk Pinto, MD  Blood Glucose Monitoring Suppl DEVI Check blood sugar fasting in am, in the evening and when feeling bad as needed. 04/11/18   Garnet Sierras, NP  celecoxib (CELEBREX) 200 MG capsule Take 1 capsule (200 mg total) by mouth 2  (two) times daily as needed. 09/29/18   Hilts, Legrand Como, MD  glucose blood test strip Use as instructed 04/11/18   Garnet Sierras, NP  Lancets (ACCU-CHEK SOFT TOUCH) lancets Use as instructed 04/11/18   Garnet Sierras, NP  metFORMIN (GLUCOPHAGE XR) 500 MG 24 hr tablet Take 1 tablet by mouth daily with biggest meal for Diabetes 04/11/18   Garnet Sierras, NP  Multiple Vitamins-Minerals (MULTIVITAMIN WITH MINERALS) tablet Take 1 tablet by mouth daily.    [provider]  OVER THE COUNTER MEDICATION     [provider]  telmisartan (MICARDIS) 40 MG tablet Take 1 tablet daily for BP 09/26/18   Unk Pinto, MD  tiZANidine (ZANAFLEX) 4 MG tablet Take 1/2 to 1 tablet (2-4 mg total) by mouth every 6 (six) hours as needed for muscle spasms. 05/07/18   Hilts, Legrand Como, MD  traMADol (ULTRAM) 50 MG tablet Take 1 tablet (50 mg total) by mouth every 6 (six) hours as needed. for pain 11/12/18   Hilts, Legrand Como, MD    Family History Family History  Problem Relation Age of Onset  . Hypertension Mother   . Arthritis Mother   . Sleep apnea Mother   . Hypertension Father   . Kidney disease Father   . Colon cancer Neg Hx     Social History Social History   Tobacco Use  . Smoking status: Never Smoker  . Smokeless tobacco:  Never Used  Substance Use Topics  . Alcohol use: No  . Drug use: No     Allergies   Patient has no known allergies.   Review of Systems Review of Systems  Constitutional: Negative for chills and fever.  HENT: Negative for ear pain and sore throat.   Eyes: Negative for pain and visual disturbance.  Respiratory: Negative for cough and shortness of breath.   Cardiovascular: Negative for chest pain and palpitations.  Gastrointestinal: Negative for abdominal pain and vomiting.  Genitourinary: Negative for dysuria and hematuria.  Musculoskeletal: Negative for arthralgias and back pain.  Skin: Positive for wound. Negative for color change and rash.   Neurological: Negative for dizziness, tremors, seizures, syncope, facial asymmetry, speech difficulty, weakness, light-headedness, numbness and headaches.  All other systems reviewed and are negative.    Physical Exam Updated Vital Signs BP (!) 139/98 (BP Location: Left Arm)   Pulse 68   Temp 99.2 F (37.3 C) (Oral)   Resp 18   Ht 6' 3.5" (1.918 m)   Wt 123.8 kg   SpO2 100%   BMI 33.67 kg/m   Physical Exam Vitals signs and nursing note reviewed.  Constitutional:      Appearance: He is well-developed.  HENT:     Head: Normocephalic and atraumatic.  Eyes:     Conjunctiva/sclera: Conjunctivae normal.  Neck:     Musculoskeletal: Neck supple.  Cardiovascular:     Rate and Rhythm: Normal rate and regular rhythm.     Heart sounds: No murmur.  Pulmonary:     Effort: Pulmonary effort is normal. No respiratory distress.     Breath sounds: Normal breath sounds.  Abdominal:     Palpations: Abdomen is soft.     Tenderness: There is no abdominal tenderness.  Skin:    General: Skin is warm and dry.     Comments: 4 cm laceration to the right side of the head in the scalp region that is hemostatic  Neurological:     General: No focal deficit present.     Mental Status: He is alert and oriented to person, place, and time.     Cranial Nerves: No cranial nerve deficit.     Sensory: No sensory deficit.     Motor: No weakness.     Gait: Gait normal.     Comments: 5+ out of 5 strength throughout, normal sensation, normal gait, normal speech      ED Treatments / Results  Labs (all labs ordered are listed, but only abnormal results are displayed) Labs Reviewed - No data to display  EKG None  Radiology No results found.  Procedures .Marland KitchenLaceration Repair  Date/Time: 01/28/2019 3:41 PM Performed by: Lennice Sites, DO Authorized by: Lennice Sites, DO   Consent:    Consent obtained:  Verbal   Consent given by:  Patient   Risks discussed:  Infection, need for additional  repair, nerve damage, pain, poor cosmetic result, poor wound healing, retained foreign body, tendon damage and vascular damage   Alternatives discussed:  No treatment Anesthesia (see MAR for exact dosages):    Anesthesia method:  None Laceration details:    Location:  Scalp   Scalp location:  R temporal   Length (cm):  4   Depth (mm):  0.5 Repair type:    Repair type:  Simple Pre-procedure details:    Preparation:  Patient was prepped and draped in usual sterile fashion Exploration:    Wound extent: no areolar tissue violation noted, no fascia  violation noted, no foreign bodies/material noted, no muscle damage noted, no nerve damage noted, no tendon damage noted, no underlying fracture noted and no vascular damage noted     Contaminated: no   Treatment:    Area cleansed with:  Saline   Amount of cleaning:  Standard   Irrigation solution:  Sterile saline   Irrigation volume:  500   Irrigation method:  Pressure wash   Visualized foreign bodies/material removed: no   Skin repair:    Repair method:  Staples   Number of staples:  3 Approximation:    Approximation:  Close Post-procedure details:    Dressing:  Open (no dressing)   Patient tolerance of procedure:  Tolerated well, no immediate complications   (including critical care time)  Medications Ordered in ED Medications  Tdap (BOOSTRIX) injection 0.5 mL (has no administration in time range)     Initial Impression / Assessment and Plan / ED Course  I have reviewed the triage vital signs and the nursing notes.  Pertinent labs & imaging results that were available during my care of the patient were reviewed by me and considered in my medical decision making (see chart for details).        Dale Tapia is a 58 year old male with history of diabetes who presents the ED with head laceration.  Patient normal vitals.  No fever.  Patient the right side of his head on a metal object.  No loss of consciousness.  No headache.   Neurologically intact.  Wound is hemostatic, 4 cm.  Simple wound that was stapled.  Given return precautions and wound care instructions.  Discharged in good condition. TDAP given.  This chart was dictated using voice recognition software.  Despite best efforts to proofread,  errors can occur which can change the documentation meaning.    Final Clinical Impressions(s) / ED Diagnoses   Final diagnoses:  Laceration of scalp, initial encounter    ED Discharge Orders    None       Lennice Sites, DO 01/28/19 [place], [place], DO 01/28/19 1559

## 2019-02-06 ENCOUNTER — Emergency Department (HOSPITAL_BASED_OUTPATIENT_CLINIC_OR_DEPARTMENT_OTHER)
Admission: EM | Admit: 2019-02-06 | Discharge: 2019-02-06 | Disposition: A | Discharge status: Home / Self Care | Payer: No Typology Code available for payment source | Attending: Emergency Medicine | Admitting: Emergency Medicine

## 2019-02-06 ENCOUNTER — Encounter (HOSPITAL_BASED_OUTPATIENT_CLINIC_OR_DEPARTMENT_OTHER): Payer: Self-pay | Admitting: Adult Health

## 2019-02-06 ENCOUNTER — Other Ambulatory Visit: Payer: Self-pay

## 2019-02-06 DIAGNOSIS — E119 Type 2 diabetes mellitus without complications: Secondary | ICD-10-CM | POA: Diagnosis not present

## 2019-02-06 DIAGNOSIS — Z7984 Long term (current) use of oral hypoglycemic drugs: Secondary | ICD-10-CM | POA: Insufficient documentation

## 2019-02-06 DIAGNOSIS — S0101XD Laceration without foreign body of scalp, subsequent encounter: Secondary | ICD-10-CM | POA: Diagnosis present

## 2019-02-06 DIAGNOSIS — Z79899 Other long term (current) drug therapy: Secondary | ICD-10-CM | POA: Diagnosis not present

## 2019-02-06 DIAGNOSIS — X58XXXD Exposure to other specified factors, subsequent encounter: Secondary | ICD-10-CM | POA: Insufficient documentation

## 2019-02-06 DIAGNOSIS — Z4802 Encounter for removal of sutures: Secondary | ICD-10-CM

## 2019-02-06 DIAGNOSIS — I10 Essential (primary) hypertension: Secondary | ICD-10-CM | POA: Insufficient documentation

## 2019-02-06 NOTE — ED Provider Notes (Signed)
[facility] EMERGENCY DEPARTMENT Provider Note   CSN: [id number] Arrival date & time: 02/06/19  1356     History   Chief Complaint Chief Complaint  Patient presents with  . Suture / Staple Removal    HPI Dale Tapia is a 58 y.o. male.     Patient presents for removal of 3 staples placed in the right scalp 9 days ago.  Patient states that the area seems to be healing well.  He has some on and off headaches but attributes this to allergies.  He denies any neuro symptoms or other concussion symptoms.     Past Medical History:  Diagnosis Date  . Allergic rhinitis, cause unspecified   . Arthritis    neck and knees  . Hyperlipidemia   . Hypertension   . Prediabetes     Patient Active Problem List   Diagnosis Date Noted  . Primary osteoarthritis of both knees 11/12/2018  . Arthrosis of right acromioclavicular joint 11/12/2018  . Type 2 diabetes mellitus with hyperglycemia ([place]) 04/11/2018  . Obesity 05/03/2014  . Prediabetes   . Hypertension   . Arthritis   . Allergic rhinitis   . Hyperlipidemia     History reviewed. No pertinent surgical history.      Home Medications    Prior to Admission medications   Medication Sig Start Date End Date Taking? Authorizing Provider  atenolol (TENORMIN) 100 MG tablet Take 1 tablet daily for BP 09/26/18   Unk Pinto, MD  Blood Glucose Monitoring Suppl DEVI Check blood sugar fasting in am, in the evening and when feeling bad as needed. 04/11/18   Garnet Sierras, NP  celecoxib (CELEBREX) 200 MG capsule Take 1 capsule (200 mg total) by mouth 2 (two) times daily as needed. 09/29/18   Hilts, Legrand Como, MD  glucose blood test strip Use as instructed 04/11/18   Garnet Sierras, NP  Lancets (ACCU-CHEK SOFT TOUCH) lancets Use as instructed 04/11/18   Garnet Sierras, NP  metFORMIN (GLUCOPHAGE XR) 500 MG 24 hr tablet Take 1 tablet by mouth daily with biggest meal for Diabetes 04/11/18   Garnet Sierras, NP  Multiple  Vitamins-Minerals (MULTIVITAMIN WITH MINERALS) tablet Take 1 tablet by mouth daily.    [provider]  OVER THE COUNTER MEDICATION     [provider]  telmisartan (MICARDIS) 40 MG tablet Take 1 tablet daily for BP 09/26/18   Unk Pinto, MD  tiZANidine (ZANAFLEX) 4 MG tablet Take 1/2 to 1 tablet (2-4 mg total) by mouth every 6 (six) hours as needed for muscle spasms. 05/07/18   Hilts, Legrand Como, MD  traMADol (ULTRAM) 50 MG tablet Take 1 tablet (50 mg total) by mouth every 6 (six) hours as needed. for pain 11/12/18   Hilts, Legrand Como, MD    Family History Family History  Problem Relation Age of Onset  . Hypertension Mother   . Arthritis Mother   . Sleep apnea Mother   . Hypertension Father   . Kidney disease Father   . Colon cancer Neg Hx     Social History Social History   Tobacco Use  . Smoking status: Never Smoker  . Smokeless tobacco: Never Used  Substance Use Topics  . Alcohol use: No  . Drug use: No     Allergies   Patient has no known allergies.   Review of Systems Review of Systems  Eyes: Negative for visual disturbance.  Gastrointestinal: Negative for nausea and vomiting.  Skin: Negative for wound.  Neurological: Positive for  headaches.     Physical Exam Updated Vital Signs BP (!) 137/92 (BP Location: Right Arm)   Pulse 92   Temp 98.5 F (36.9 C) (Oral)   Resp 18   SpO2 97%   Physical Exam Skin:    Comments: Patient with 3 staples in place, right anterior scalp.  Wound appears to be well-healing.  No signs of infection or drainage.      ED Treatments / Results  Labs (all labs ordered are listed, but only abnormal results are displayed) Labs Reviewed - No data to display  EKG None  Radiology No results found.  Procedures .Suture Removal  Date/Time: 02/06/2019 2:22 PM Performed by: Carlisle Cater, PA-C Authorized by: Carlisle Cater, PA-C   Consent:    Consent obtained:  Verbal   Consent given by:  Patient   Risks  discussed:  Pain   Alternatives discussed:  No treatment Location:    Location:  Head/neck   Head/neck location:  Scalp Procedure details:    Wound appearance:  No signs of infection, good wound healing and clean   Number of staples removed:  3 Post-procedure details:    Post-removal:  No dressing applied   Patient tolerance of procedure:  Tolerated well, no immediate complications   (including critical care time)  Medications Ordered in ED Medications - No data to display   Initial Impression / Assessment and Plan / ED Course  I have reviewed the triage vital signs and the nursing notes.  Pertinent labs & imaging results that were available during my care of the patient were reviewed by me and considered in my medical decision making (see chart for details).        Patient counseled on continued wound care, return with any signs of infection.  He verbalizes understanding agrees with plan.  States that getting the staples out was easier than when he had sutures in the past.  Final Clinical Impressions(s) / ED Diagnoses   Final diagnoses:  Encounter for staple removal   Patient here for staple removal, wound appears to be well-healing without signs of infection.  ED Discharge Orders    None       Carlisle Cater, Hershal Coria 02/06/19 1423    Isla Pence, MD 02/10/19 [phone]

## 2019-02-06 NOTE — ED Triage Notes (Addendum)
Requesting removal of 3 staples to right side of head, placed 9 days ago.

## 2019-03-20 ENCOUNTER — Encounter: Payer: Self-pay | Admitting: Physician Assistant

## 2019-05-13 ENCOUNTER — Encounter: Payer: Self-pay | Admitting: Orthopaedic Surgery

## 2019-05-13 ENCOUNTER — Other Ambulatory Visit: Payer: Self-pay

## 2019-05-13 ENCOUNTER — Ambulatory Visit (INDEPENDENT_AMBULATORY_CARE_PROVIDER_SITE_OTHER): Payer: Managed Care, Other (non HMO) | Admitting: Orthopaedic Surgery

## 2019-05-13 DIAGNOSIS — M170 Bilateral primary osteoarthritis of knee: Secondary | ICD-10-CM

## 2019-05-13 DIAGNOSIS — M25561 Pain in right knee: Secondary | ICD-10-CM

## 2019-05-13 DIAGNOSIS — M25562 Pain in left knee: Secondary | ICD-10-CM | POA: Diagnosis not present

## 2019-05-13 DIAGNOSIS — G8929 Other chronic pain: Secondary | ICD-10-CM

## 2019-05-13 MED ORDER — LIDOCAINE HCL 1 % IJ SOLN
3.0000 mL | INTRAMUSCULAR | Status: AC | PRN
  Administered 2019-05-13: 3 mL

## 2019-05-13 MED ORDER — METHYLPREDNISOLONE ACETATE 40 MG/ML IJ SUSP
40.0000 mg | INTRAMUSCULAR | Status: AC | PRN
  Administered 2019-05-13: 40 mg via INTRA_ARTICULAR

## 2019-05-13 NOTE — Progress Notes (Signed)
Office Visit Note   Patient: Dale Tapia           Date of Birth: 08/23/1960           MRN: [id number] Visit Date: 05/13/2019              Requested by: Unk Pinto, [address] [place] [address],  [place] [address] PCP: Unk Pinto, MD   Assessment & Plan: Visit Diagnoses:  1. Primary osteoarthritis of both knees   2. Chronic pain of right knee   3. Chronic pain of left knee     Plan: Per the patient's wishes I was able to provide a steroid injection of both knees today.  He understands fully the risk and benefits of injections having had them before.  All question concerns were answered and addressed.  Follow will be as needed.  Follow-Up Instructions: Return if symptoms worsen or fail to improve.   Orders:  Orders Placed This Encounter  Procedures  . Large Joint Inj  . Large Joint Inj   No orders of the defined types were placed in this encounter.     Procedures: Large Joint Inj: R knee on 05/13/2019 9:41 AM Indications: diagnostic evaluation and pain Details: 22 G 1.5 in needle, superolateral approach  Arthrogram: No  Medications: 3 mL lidocaine 1 %; 40 mg methylPREDNISolone acetate 40 MG/ML Outcome: tolerated well, no immediate complications Procedure, treatment alternatives, risks and benefits explained, specific risks discussed. Consent was given by the patient. Immediately prior to procedure a time out was called to verify the correct patient, procedure, equipment, support staff and site/side marked as required. Patient was prepped and draped in the usual sterile fashion.   Large Joint Inj: L knee on 05/13/2019 9:42 AM Indications: diagnostic evaluation and pain Details: 22 G 1.5 in needle, superolateral approach  Arthrogram: No  Medications: 3 mL lidocaine 1 %; 40 mg methylPREDNISolone acetate 40 MG/ML Outcome: tolerated well, no immediate complications Procedure, treatment alternatives, risks and benefits explained, specific risks  discussed. Consent was given by the patient. Immediately prior to procedure a time out was called to verify the correct patient, procedure, equipment, support staff and site/side marked as required. Patient was prepped and draped in the usual sterile fashion.       Clinical Data: No additional findings.   Subjective: Chief Complaint  Patient presents with  . Right Knee - Injections  . Left Knee - Injections  The patient is someone with known well-documented severe tricompartment arthritis of both his knees.  Both knees have varus malalignment and significant osteoarthritis.  He has had injections in both knees with a steroid from time to time.  The last time he had injections was about 6 months ago.  He is requesting these again today.  He is somewhat he does work 11 hours a day on his feet all day.  Going up and down stairs is causing some problems with his knees.  He is very active 58 year old gentleman.  He is still not at the point where he wants to proceed with knee replacement just yet however his knees are giving him a problem on a daily basis.  He does state that injections last for a while when he does have them.  He has had no other active acute changes in medical status.  HPI  Review of Systems He currently denies any headache, chest pain, shortness of breath, fever, chills, nausea, vomiting  Objective: Vital Signs: There were no vitals taken for  this visit.  Physical Exam He is alert and orient x3 and in no acute distress Ortho Exam Examination of both knees shows varus malalignment with patellofemoral crepitation.  Both knees have a slight effusion.  Both knees are ligamentously stable with good range of motion. Specialty Comments:  No specialty comments available.  Imaging: No results found. Previous x-rays of both knees show varus malalignment with significant and severe tricompartmental arthritis.  PMFS History: Patient Active Problem List   Diagnosis Date Noted    . Primary osteoarthritis of both knees 11/12/2018  . Arthrosis of right acromioclavicular joint 11/12/2018  . Type 2 diabetes mellitus with hyperglycemia ([place]) 04/11/2018  . Obesity 05/03/2014  . Prediabetes   . Hypertension   . Arthritis   . Allergic rhinitis   . Hyperlipidemia    Past Medical History:  Diagnosis Date  . Allergic rhinitis, cause unspecified   . Arthritis    neck and knees  . Hyperlipidemia   . Hypertension   . Prediabetes     Family History  Problem Relation Age of Onset  . Hypertension Mother   . Arthritis Mother   . Sleep apnea Mother   . Hypertension Father   . Kidney disease Father   . Colon cancer Neg Hx     History reviewed. No pertinent surgical history. Social History   Occupational History  . Not on file  Tobacco Use  . Smoking status: Never Smoker  . Smokeless tobacco: Never Used  Substance and Sexual Activity  . Alcohol use: No  . Drug use: No  . Sexual activity: Not on file

## 2019-06-01 ENCOUNTER — Other Ambulatory Visit: Payer: Self-pay

## 2019-06-01 ENCOUNTER — Ambulatory Visit (INDEPENDENT_AMBULATORY_CARE_PROVIDER_SITE_OTHER): Payer: Managed Care, Other (non HMO) | Admitting: Orthopaedic Surgery

## 2019-06-01 ENCOUNTER — Ambulatory Visit (INDEPENDENT_AMBULATORY_CARE_PROVIDER_SITE_OTHER): Payer: Managed Care, Other (non HMO)

## 2019-06-01 DIAGNOSIS — M545 Low back pain, unspecified: Secondary | ICD-10-CM

## 2019-06-01 MED ORDER — METHYLPREDNISOLONE 4 MG PO TABS: ORAL_TABLET | ORAL | 0 refills | Status: DC

## 2019-06-01 MED ORDER — TIZANIDINE HCL 4 MG PO TABS: 4.0000 mg | ORAL_TABLET | Freq: Three times a day (TID) | ORAL | 3 refills | Status: DC | PRN

## 2019-06-01 NOTE — Progress Notes (Deleted)
Complete Physical  Assessment and Plan:  Essential hypertension - continue medications, DASH diet, exercise and monitor at home. Call if greater than 130/80.  With sinus brady and weight loss will cut atenolol in half and monitor bp at home -     CBC with Differential/Platelet -     BASIC METABOLIC PANEL WITH GFR -     Hepatic function panel -     TSH -     Microalbumin / creatinine urine ratio -     Urinalysis w microscopic + reflex cultur -     EKG 12-Lead  Seasonal allergic rhinitis, unspecified trigger Continue OTC meds  Arthritis Better with weight loss, follows ortho for knees  Mixed hyperlipidemia -     Lipid panel -continue medications, check lipids, decrease fatty foods, increase activity.   Diabetes with hyperlipidemia -     Hemoglobin A1c Great job with weight loss, will recheck, likely out of DM range - continue aASA, no ACE/ARB at this time  Class 2 severe obesity due to excess calories with serious comorbidity and body mass index (BMI) of 35.0 to 35.9 in adult [facility]) Morbid Obesity with co morbidities - long discussion about weight loss, diet, and exercise  Medication management -     Magnesium  Vitamin D deficiency -     VITAMIN D 25 Hydroxy (Vit-D Deficiency, Fractures)  Screening, anemia, deficiency, iron -     Iron,Total/Total Iron Binding Cap -     Vitamin B12  Encounter for general adult medical examination with abnormal findings 1 year   Discussed med's effects and SE's. Screening labs and tests as requested with regular follow-up as recommended. Future Appointments  Date Time Provider [place]  06/05/2019 10:30 AM Vicie Mutters, PA-C [facility] None  06/06/2020 10:00 AM Vicie Mutters, PA-C [facility] None    HPI Patient presents for a complete physical.   His blood pressure has been controlled at home, today their BP is   He does not workout. He denies chest pain, shortness of breath, dizziness.   BMI is There is no height or  weight on file to calculate BMI., he is working on diet and exercise. Wt Readings from Last 3 Encounters:  01/28/19 273 lb (123.8 kg)  01/20/19 273 lb (123.8 kg)  05/26/18 284 lb (128.8 kg)    He is on cholesterol medication and denies myalgias. His cholesterol is at goal. The cholesterol last visit was:   Lab Results  Component Value Date   CHOL 182 02/28/2018   HDL 49 02/28/2018   LDLCALC 114 (H) 02/28/2018   TRIG 87 02/28/2018   CHOLHDL 3.7 02/28/2018   He has been working on diet and exercise for diabetes, With hyperlipidemia NOT at goal, not on medication With CKD stage 2  on ARB he is not on bASA On metformin and denies foot ulcerations, hyperglycemia, hypoglycemia , increased appetite, nausea, paresthesia of the feet, polydipsia, polyuria, visual disturbances, vomiting and weight loss.  Last A1C in the office was:  Lab Results  Component Value Date   HGBA1C 7.3 (H) 02/28/2018   Lab Results  Component Value Date   GFRNONAA 78 05/26/2018   Patient is on Vitamin D supplement, 5000 IU Lab Results  Component Value Date   VD25OH 30 02/28/2018   He is on thyroid medication. His medication was not changed last visit.   Lab Results  Component Value Date   TSH 2.52 02/28/2018  .    Last PSA was: Lab Results  Component  Value Date   PSA 0.47 11/18/2015    Current Medications:  Current Outpatient Medications on File Prior to Visit  Medication Sig Dispense Refill  . atenolol (TENORMIN) 100 MG tablet Take 1 tablet daily for BP 90 tablet 1  . Blood Glucose Monitoring Suppl DEVI Check blood sugar fasting in am, in the evening and when feeling bad as needed. 1 each 0  . celecoxib (CELEBREX) 200 MG capsule Take 1 capsule (200 mg total) by mouth 2 (two) times daily as needed. 180 capsule 1  . glucose blood test strip Use as instructed 100 each 12  . Lancets (ACCU-CHEK SOFT TOUCH) lancets Use as instructed 100 each 12  . metFORMIN (GLUCOPHAGE XR) 500 MG 24 hr tablet Take 1  tablet by mouth daily with biggest meal for Diabetes 90 tablet 11  . methylPREDNISolone (MEDROL) 4 MG tablet Medrol dose pack. Take as instructed 21 tablet 0  . Multiple Vitamins-Minerals (MULTIVITAMIN WITH MINERALS) tablet Take 1 tablet by mouth daily.    Marland Kitchen OVER THE COUNTER MEDICATION     . telmisartan (MICARDIS) 40 MG tablet Take 1 tablet daily for BP 90 tablet 1  . tiZANidine (ZANAFLEX) 4 MG tablet Take 1 tablet (4 mg total) by mouth every 8 (eight) hours as needed for muscle spasms. 60 tablet 3  . traMADol (ULTRAM) 50 MG tablet Take 1 tablet (50 mg total) by mouth every 6 (six) hours as needed. for pain 40 tablet 0   No current facility-administered medications on file prior to visit.    Health Maintenance:  Immunization History  Administered Date(s) Administered  . Td 06/12/2005  . Tdap 01/18/2012, 01/28/2019   Tetanus: 2013 Flu vaccine:Declined Zostavax: Not indicated  Colonoscopy: 2014 Eye Exam: 2018 DUE, Dr. [place] Dentist: Twice yearly visits  Patient Care Team: Unk Pinto, MD as PCP - General (Internal Medicine) Irene Shipper, MD as Consulting Physician (Gastroenterology)  Medical History:  Past Medical History:  Diagnosis Date  . Allergic rhinitis, cause unspecified   . Arthritis    neck and knees  . Hyperlipidemia   . Hypertension   . Prediabetes    Allergies No Known Allergies  SURGICAL HISTORY He  has no past surgical history on file. FAMILY HISTORY His family history includes Arthritis in his mother; Hypertension in his father and mother; Kidney disease in his father; Sleep apnea in his mother. SOCIAL HISTORY He  reports that he has never smoked. He has never used smokeless tobacco. He reports that he does not drink alcohol or use drugs.   Review of Systems:  Review of Systems  Constitutional: Negative for chills, fever and malaise/fatigue.  HENT: Negative for congestion, ear pain and sore throat.   Respiratory: Negative for cough, shortness of  breath and wheezing.   Cardiovascular: Negative for chest pain, palpitations and leg swelling.  Gastrointestinal: Positive for abdominal pain and diarrhea. Negative for blood in stool, constipation, heartburn, melena, nausea and vomiting.  Genitourinary: Negative.   Neurological: Negative for dizziness, sensory change, loss of consciousness and headaches.  Psychiatric/Behavioral: Negative for depression and memory loss. The patient is not nervous/anxious and does not have insomnia.     Physical Exam: Estimated body mass index is 33.67 kg/m as calculated from the following:   Height as of 01/28/19: 6' 3.5" (1.918 m).   Weight as of 01/28/19: 273 lb (123.8 kg). There were no vitals taken for this visit.  General Appearance: Well nourished, in no apparent distress.  Eyes: PERRLA, EOMs, conjunctiva no swelling  or erythema ENT/Mouth: Ear canals clear bilaterally with no erythema, swelling, discharge.  TMs normal bilaterally with no erythema, bulging, or retractions.  Oropharynx clear and moist with no exudate, swelling, or erythema.  Dentition normal.   Neck: Supple, thyroid normal. No bruits, JVD, cervical adenopathy Respiratory: Respiratory effort normal, BS equal bilaterally without rales, rhonchi, wheezing or stridor.  Cardio: RRR without murmurs, rubs or gallops. Brisk peripheral pulses without edema.  Chest: symmetric, with normal excursions Abdomen: Soft, nontender, no guarding, rebound, hernias, masses, or organomegaly. Musculoskeletal: Full ROM all peripheral extremities,5/5 strength, and normal gait.  Skin: Warm, dry without rashes, lesions, ecchymosis. Neuro: A&Ox3, Cranial nerves intact, reflexes equal bilaterally. Normal muscle tone, no cerebellar symptoms. Sensation intact.  Psych: Normal affect, Insight and Judgment appropriate.   EKG: WNL no changes.  AORTA SCAN: WNL  Over 40 minutes of exam, counseling, chart review and critical decision making was performed  Vicie Mutters 12:44 PM [facility]

## 2019-06-01 NOTE — Progress Notes (Signed)
Office Visit Note   Patient: Dale Tapia           Date of Birth: Feb 11, 1961           MRN: [id number] Visit Date: 06/01/2019              Requested by: Unk Pinto, [address] [place],  [place] [address] PCP: Unk Pinto, MD   Assessment & Plan: Visit Diagnoses:  1. Acute right-sided low back pain without sciatica     Plan: His signs and symptoms and clinical exam are consistent with musculoskeletal strain of the lumbar spine.  I will have him stay out of work the remainder of this week and try a steroid taper and Zanaflex.  He will also try some Voltaren gel in his back.  He will alternate ice and heat and massage in his back.  Hopefully this will continue to resolve.  He can return to work next week.  However, if there is continued issues he will let us know.  All question concerns were answered and addressed.  Follow-Up Instructions: Return if symptoms worsen or fail to improve.   Orders:  Orders Placed This Encounter  Procedures  . XR Lumbar Spine 2-3 Views   Meds ordered this encounter  Medications  . methylPREDNISolone (MEDROL) 4 MG tablet    Sig: Medrol dose pack. Take as instructed    Dispense:  21 tablet    Refill:  0  . tiZANidine (ZANAFLEX) 4 MG tablet    Sig: Take 1 tablet (4 mg total) by mouth every 8 (eight) hours as needed for muscle spasms.    Dispense:  60 tablet    Refill:  3    This prescription was filled on 05/07/2018. Any refills authorized will be placed on file.      Procedures: No procedures performed   Clinical Data: No additional findings.   Subjective: Chief Complaint  Patient presents with  . Lower Back - Pain  The patient comes in with acute low back pain.  There is no radicular component.  Its only at the lower lumbar spine and just to the right side.  He was lifting something and he felt pain back there.  He denies any numbness and tingling his feet or weakness.  He says he has been out of work  this past week so that has helped somewhat so he does feel like it slowly getting better.  This is happened once before and muscle relaxants did help.  He denies any change in bowel bladder function or acute changes in his medical status.  HPI  Review of Systems He currently denies any headache, chest pain, shortness of breath, fever, chills, nausea, vomiting  Objective: Vital Signs: There were no vitals taken for this visit.  Physical Exam He is alert and oriented x3 and in no acute distress Ortho Exam Examination of his lumbar spine shows pain in the paraspinal muscles to the right side.  The remainder of his lower extremity exam on both sides is completely normal with 5 out of 5 strength and normal sensation. Specialty Comments:  No specialty comments available.  Imaging: XR Lumbar Spine 2-3 Views  Result Date: 06/01/2019 2 views of the lumbar spine show degenerative changes at several levels.  There is an otherwise no acute findings.    PMFS History: Patient Active Problem List   Diagnosis Date Noted  . Primary osteoarthritis of both knees 11/12/2018  . Arthrosis of right acromioclavicular  joint 11/12/2018  . Type 2 diabetes mellitus with hyperglycemia ([place]) 04/11/2018  . Obesity 05/03/2014  . Prediabetes   . Hypertension   . Arthritis   . Allergic rhinitis   . Hyperlipidemia    Past Medical History:  Diagnosis Date  . Allergic rhinitis, cause unspecified   . Arthritis    neck and knees  . Hyperlipidemia   . Hypertension   . Prediabetes     Family History  Problem Relation Age of Onset  . Hypertension Mother   . Arthritis Mother   . Sleep apnea Mother   . Hypertension Father   . Kidney disease Father   . Colon cancer Neg Hx     No past surgical history on file. Social History   Occupational History  . Not on file  Tobacco Use  . Smoking status: Never Smoker  . Smokeless tobacco: Never Used  Substance and Sexual Activity  . Alcohol use: No  . Drug  use: No  . Sexual activity: Not on file

## 2019-06-04 DIAGNOSIS — N182 Chronic kidney disease, stage 2 (mild): Secondary | ICD-10-CM | POA: Insufficient documentation

## 2019-06-04 DIAGNOSIS — E1122 Type 2 diabetes mellitus with diabetic chronic kidney disease: Secondary | ICD-10-CM | POA: Insufficient documentation

## 2019-06-04 NOTE — Progress Notes (Signed)
Complete Physical  Assessment and Plan:   Encounter for general adult medical examination with abnormal findings 1 year  Essential hypertension - severely elevated; ? Poor compliance with medication, start checking BP, will refill BP meds and sent in HCTZ to add for BP goal <130/80; follow up 4 weeks  - continue DASH diet, weight loss, exercise and monitor at home. Call if greater than 130/80.  -     CBC with Differential/Platelet -     CMP/GFR -     TSH -     Microalbumin / creatinine urine ratio -     Urinalysis w microscopic + reflex cultur -     EKG 12-Lead  Seasonal allergic rhinitis, unspecified trigger Continue OTC meds  Arthritis Better with weight loss, follows ortho for knees  Mixed hyperlipidemia associated with T2DM ([place]) -     Have recommended statin due to T2DM and LDL goal which he has declined earlier but agreeable to starting pending lab results tomorrow; plan on rosuvastatin 5 mg daily  -     LDL goal <70 -     continue medications, check lipids, decrease fatty foods, increase activity.  -     Lipid panel  Type 2 Diabetes Mellitus with CKD  Education: Reviewed 'ABCs' of diabetes management (respective goals in parentheses):  A1C (<7), blood pressure (<130/80), and cholesterol (LDL <70) Recommended addition of ASA and statin Eye Exam yearly and Dental Exam every 6 months - reminded to schedule as overdue Emphasized needs to start checking fasting glucose; close follow up if indicated by A1C today  Dietary recommendations Physical Activity recommendations -     Hemoglobin A1c -     Microalbumin / creatinine urine ratio -     Urinalysis w microscopic + reflex cultur -     Diabetic foot exam   CKD II associated with T2DM (H[facility]) Increase fluids, avoid NSAIDS, monitor sugars, will monitor      -       CMP/GFR  Obesity - BMI 33 - long discussion about weight loss, diet, and exercise  Medication management -     Magnesium  Vitamin D deficiency -      VITAMIN D 25 Hydroxy (Vit-D Deficiency, Fractures)  Screening prostate cancer      -      PSA  Screening, anemia, deficiency, iron -     Iron,Total/Total Iron Binding Cap  History of colon polyps/screening colon cancer ? Overdue, adenomatous polyps in 2014 with 5 year recall by Dr. Henrene Pastor Referral placed back to GI    Discussed med's effects and SE's. Screening labs and tests as requested with regular follow-up as recommended. Future Appointments  Date Time Provider [place]  09/04/2019  8:45 AM Liane Comber, NP [facility] None  12/04/2019  8:45 AM Liane Comber, NP [facility] None  06/06/2020 10:00 AM Vicie Mutters, PA-C [facility] None    HPI 59 y.o. male patient presents for a complete physical. He has Hypertension; Arthritis; Allergic rhinitis; Hyperlipidemia associated with type 2 diabetes mellitus ([place]); Obesity (BMI 30.0-34.9); Type 2 diabetes mellitus with hyperglycemia ([place]); Primary osteoarthritis of both knees; Arthrosis of right acromioclavicular joint; and CKD stage 2 due to type 2 diabetes mellitus ([place]) on their problem list.   Married, 3 daughters, granddaughter on the way, works in [place], very busy this year working long hours.   He follows with Dr. Ninfa Linden for bil knee arthritis and other orthopedic concerns, out of work this week due to lumbar strain, doing  steroid taper which is helping.    BMI is Body mass index is 33.67 kg/m., he has been working on diet and exercise, wants to lose weight this year with wife, was doing well with protein shake breakfast replacement with regular small meals during the day. Ultimately wants to get <180lb.  Wt Readings from Last 3 Encounters:  06/05/19 273 lb (123.8 kg)  01/28/19 273 lb (123.8 kg)  01/20/19 273 lb (123.8 kg)   He has not been checking BP due cuff out of batteries, today their BP is BP: (!) 160/100 Similar by manual recheck by provider; currently taking atenolol 100 mg, telmisartan 40 mg  daily; he reports has been out of "BP supplement" that helps and will get back on   He does not workout citing lack of time. He denies chest pain, shortness of breath, dizziness.    He is not on cholesterol medication, has been advised statin but declined in lieu of lifestyle changes. His cholesterol is not at goal of LDL <70. The cholesterol last visit was:   Lab Results  Component Value Date   CHOL 182 02/28/2018   HDL 49 02/28/2018   LDLCALC 114 (H) 02/28/2018   TRIG 87 02/28/2018   CHOLHDL 3.7 02/28/2018   He has been working on diet and exercise for diabetes, was prescribed metformin but states he felt poorly and stopped taking, doing lifestyle only he is not on bASA, he is on ACE/ARB and denies foot ulcerations, hyperglycemia, hypoglycemia , increased appetite, nausea, paresthesia of the feet, polydipsia, polyuria, visual disturbances, vomiting and weight loss. He admits hasn't been checking glucose, "too busy." Last A1C in the office was:  Lab Results  Component Value Date   HGBA1C 7.3 (H) 02/28/2018   He has CKD II associated with T2DM and htn monitored at this office:  Lab Results  Component Value Date   [facility] 78 05/26/2018   Patient is on Vitamin D supplement, 5000 IU Lab Results  Component Value Date   VD25OH 30 02/28/2018   Last PSA was: Lab Results  Component Value Date   PSA 0.47 11/18/2015  He denies straining, slow stream, frequency, nocturia, dribbling.     Current Medications:  Current Outpatient Medications on File Prior to Visit  Medication Sig Dispense Refill  . Blood Glucose Monitoring Suppl DEVI Check blood sugar fasting in am, in the evening and when feeling bad as needed. 1 each 0  . celecoxib (CELEBREX) 200 MG capsule Take 1 capsule (200 mg total) by mouth 2 (two) times daily as needed. 180 capsule 1  . glucose blood test strip Use as instructed 100 each 12  . Lancets (ACCU-CHEK SOFT TOUCH) lancets Use as instructed 100 each 12  .  methylPREDNISolone (MEDROL) 4 MG tablet Medrol dose pack. Take as instructed 21 tablet 0  . Multiple Vitamins-Minerals (MULTIVITAMIN WITH MINERALS) tablet Take 1 tablet by mouth daily.    Marland Kitchen OVER THE COUNTER MEDICATION     . tiZANidine (ZANAFLEX) 4 MG tablet Take 1 tablet (4 mg total) by mouth every 8 (eight) hours as needed for muscle spasms. 60 tablet 3  . traMADol (ULTRAM) 50 MG tablet Take 1 tablet (50 mg total) by mouth every 6 (six) hours as needed. for pain 40 tablet 0  . metFORMIN (GLUCOPHAGE XR) 500 MG 24 hr tablet Take 1 tablet by mouth daily with biggest meal for Diabetes (Patient not taking: Reported on 06/05/2019) 90 tablet 11   No current facility-administered medications on file prior to  visit.    Health Maintenance:  Immunization History  Administered Date(s) Administered  . Td 06/12/2005  . Tdap 01/18/2012, 01/28/2019   Tetanus: 01/2019 Flu vaccine: Declined Shingrix: -   Colonoscopy: 2014, adenomatous polyps Dr. Henrene Pastor, ovedue 5 year follow up?   Eye Exam: 2018 DUE, Dr. [place] - needs diabetes eye - reminded to schedule Dentist: Twice yearly visits though admits hasn't been in 2020 citing pandemic; reminded to schedule  Patient Care Team: Unk Pinto, MD as PCP - General (Internal Medicine) Irene Shipper, MD as Consulting Physician (Gastroenterology) Mcarthur Rossetti, MD as Consulting Physician (Orthopedic Surgery)  Medical History:  Past Medical History:  Diagnosis Date  . Allergic rhinitis, cause unspecified   . Arthritis    neck and knees  . Hyperlipidemia   . Hypertension   . Prediabetes    Allergies Allergies  Allergen Reactions  . Bee Venom Hives    SURGICAL HISTORY He  has a past surgical history that includes Colonoscopy (2015). FAMILY HISTORY His family history includes Alzheimer's disease in his paternal grandfather; Arthritis in his mother; Crohn's disease in his daughter and father; Hypertension in his father and mother; Kidney  disease in his father; Sleep apnea in his mother. SOCIAL HISTORY He  reports that he has never smoked. He has never used smokeless tobacco. He reports that he does not drink alcohol or use drugs.   Review of Systems:  Review of Systems  Constitutional: Negative for malaise/fatigue and weight loss.  HENT: Negative for hearing loss and tinnitus.   Eyes: Negative for blurred vision and double vision.  Respiratory: Negative for cough, sputum production, shortness of breath and wheezing.   Cardiovascular: Negative for chest pain, palpitations, orthopnea, claudication, leg swelling and PND.  Gastrointestinal: Negative for abdominal pain, blood in stool, constipation, diarrhea, heartburn, melena, nausea and vomiting.  Genitourinary: Negative.   Musculoskeletal: Positive for back pain (strain, saw Dr. Ninfa Linden). Negative for falls, joint pain and myalgias.  Skin: Negative for rash.  Neurological: Negative for dizziness, tingling, sensory change, weakness and headaches.  Endo/Heme/Allergies: Negative for polydipsia.  Psychiatric/Behavioral: Negative.  Negative for depression, memory loss, substance abuse and suicidal ideas. The patient is not nervous/anxious and does not have insomnia.   All other systems reviewed and are negative.   Physical Exam: Estimated body mass index is 33.67 kg/m as calculated from the following:   Height as of this encounter: 6' 3.5" (1.918 m).   Weight as of this encounter: 273 lb (123.8 kg). BP (!) 160/100   Pulse 67   Temp 97.7 F (36.5 C)   Ht 6' 3.5" (1.918 m)   Wt 273 lb (123.8 kg)   SpO2 97%   BMI 33.67 kg/m   General Appearance: Well nourished, in no apparent distress.  Eyes: PERRLA, EOMs, conjunctiva no swelling or erythema ENT/Mouth: Ear canals clear bilaterally with no erythema, swelling, discharge.  TMs normal bilaterally with no erythema, bulging, or retractions.  Oropharynx clear and moist with no exudate, swelling, or erythema.  Dentition normal.    Neck: Supple, thyroid normal. No bruits, JVD, cervical adenopathy Respiratory: Respiratory effort normal, BS equal bilaterally without rales, rhonchi, wheezing or stridor.  Cardio: RRR without murmurs, rubs or gallops. Brisk peripheral pulses without edema.  Chest: symmetric, with normal excursions Abdomen: Soft, obese abdomen, nontender, no guarding, rebound, hernias, masses, or organomegaly. Musculoskeletal: Full ROM all peripheral extremities, 5/5 strength, and normal gait.  Skin: Warm, dry without rashes, lesions, ecchymosis. Neuro: A&Ox3, Cranial nerves intact, reflexes equal  bilaterally. Normal muscle tone, no cerebellar symptoms. Sensation intact bil feet by monofilament.  Psych: Normal affect, Insight and Judgment appropriate.   EKG: WNL no changes.   Over 40 minutes of exam, counseling, chart review and critical decision making was performed  Izora Ribas 1:24 PM [facility]

## 2019-06-05 ENCOUNTER — Other Ambulatory Visit: Payer: Self-pay

## 2019-06-05 ENCOUNTER — Encounter: Payer: Self-pay | Admitting: Physician Assistant

## 2019-06-05 ENCOUNTER — Ambulatory Visit (INDEPENDENT_AMBULATORY_CARE_PROVIDER_SITE_OTHER): Payer: Managed Care, Other (non HMO) | Admitting: Adult Health

## 2019-06-05 ENCOUNTER — Encounter: Payer: Self-pay | Admitting: Adult Health

## 2019-06-05 VITALS — BP 158/106 | HR 67 | Temp 97.7°F | Ht 75.5 in | Wt 273.0 lb

## 2019-06-05 DIAGNOSIS — E1165 Type 2 diabetes mellitus with hyperglycemia: Secondary | ICD-10-CM

## 2019-06-05 DIAGNOSIS — R350 Frequency of micturition: Secondary | ICD-10-CM | POA: Diagnosis not present

## 2019-06-05 DIAGNOSIS — Z130 Encounter for screening for diseases of the blood and blood-forming organs and certain disorders involving the immune mechanism: Secondary | ICD-10-CM | POA: Diagnosis not present

## 2019-06-05 DIAGNOSIS — Z131 Encounter for screening for diabetes mellitus: Secondary | ICD-10-CM

## 2019-06-05 DIAGNOSIS — N182 Chronic kidney disease, stage 2 (mild): Secondary | ICD-10-CM

## 2019-06-05 DIAGNOSIS — E559 Vitamin D deficiency, unspecified: Secondary | ICD-10-CM | POA: Diagnosis not present

## 2019-06-05 DIAGNOSIS — E1169 Type 2 diabetes mellitus with other specified complication: Secondary | ICD-10-CM

## 2019-06-05 DIAGNOSIS — Z13220 Encounter for screening for lipoid disorders: Secondary | ICD-10-CM

## 2019-06-05 DIAGNOSIS — Z79899 Other long term (current) drug therapy: Secondary | ICD-10-CM | POA: Diagnosis not present

## 2019-06-05 DIAGNOSIS — Z86010 Personal history of colonic polyps: Secondary | ICD-10-CM | POA: Insufficient documentation

## 2019-06-05 DIAGNOSIS — E1122 Type 2 diabetes mellitus with diabetic chronic kidney disease: Secondary | ICD-10-CM

## 2019-06-05 DIAGNOSIS — Z125 Encounter for screening for malignant neoplasm of prostate: Secondary | ICD-10-CM | POA: Diagnosis not present

## 2019-06-05 DIAGNOSIS — E785 Hyperlipidemia, unspecified: Secondary | ICD-10-CM

## 2019-06-05 DIAGNOSIS — M1990 Unspecified osteoarthritis, unspecified site: Secondary | ICD-10-CM

## 2019-06-05 DIAGNOSIS — Z1389 Encounter for screening for other disorder: Secondary | ICD-10-CM | POA: Diagnosis not present

## 2019-06-05 DIAGNOSIS — Z136 Encounter for screening for cardiovascular disorders: Secondary | ICD-10-CM | POA: Diagnosis not present

## 2019-06-05 DIAGNOSIS — Z1329 Encounter for screening for other suspected endocrine disorder: Secondary | ICD-10-CM

## 2019-06-05 DIAGNOSIS — Z0000 Encounter for general adult medical examination without abnormal findings: Secondary | ICD-10-CM

## 2019-06-05 DIAGNOSIS — Z0001 Encounter for general adult medical examination with abnormal findings: Secondary | ICD-10-CM

## 2019-06-05 DIAGNOSIS — E669 Obesity, unspecified: Secondary | ICD-10-CM

## 2019-06-05 DIAGNOSIS — I10 Essential (primary) hypertension: Secondary | ICD-10-CM | POA: Diagnosis not present

## 2019-06-05 DIAGNOSIS — N401 Enlarged prostate with lower urinary tract symptoms: Secondary | ICD-10-CM | POA: Diagnosis not present

## 2019-06-05 DIAGNOSIS — Z1211 Encounter for screening for malignant neoplasm of colon: Secondary | ICD-10-CM

## 2019-06-05 DIAGNOSIS — J302 Other seasonal allergic rhinitis: Secondary | ICD-10-CM

## 2019-06-05 MED ORDER — TELMISARTAN 40 MG PO TABS: ORAL_TABLET | ORAL | 1 refills | Status: DC

## 2019-06-05 MED ORDER — HYDROCHLOROTHIAZIDE 25 MG PO TABS: ORAL_TABLET | ORAL | 1 refills | Status: DC

## 2019-06-05 MED ORDER — ATENOLOL 100 MG PO TABS: ORAL_TABLET | ORAL | 1 refills | Status: DC

## 2019-06-05 NOTE — Patient Instructions (Signed)
Dale Tapia , Thank you for taking time to come for your Annual Wellness Visit. I appreciate your ongoing commitment to your health goals. Please review the following plan we discussed and let me know if I can assist you in the future.   These are the goals we discussed: Goals    . Blood Pressure < 130/80    . Weight (lb) < 250 lb (113.4 kg)       This is a list of the screening recommended for you and due dates:  Health Maintenance  Topic Date Due  . Pneumococcal vaccine  09/20/1964  . Eye exam for diabetics  09/20/1972  . HIV Screening  09/20/1977  . Colon Cancer Screening  03/12/2018  . Hemoglobin A1C  08/30/2018  . Complete foot exam   06/04/2020  . Tetanus Vaccine  01/27/2029  .  Hepatitis C: One time screening is recommended by Center for Disease Control  (CDC) for  adults born from [birth year] through 1963.   Completed  . Flu Shot  Discontinued     Know what a healthy weight is for you (roughly BMI <25) and aim to maintain this  Aim for 7+ servings of fruits and vegetables daily  65-80+ fluid ounces of water or unsweet tea for healthy kidneys  Limit to max 1 drink of alcohol per day; avoid smoking/tobacco  Limit animal fats in diet for cholesterol and heart health - choose grass fed whenever available  Avoid highly processed foods, and foods high in saturated/trans fats  Aim for low stress - take time to unwind and care for your mental health  Aim for 150 min of moderate intensity exercise weekly for heart health, and weights twice weekly for bone health  Aim for 7-9 hours of sleep daily  Terbinafine tablets What is this medicine? TERBINAFINE (TER bin a feen) is an antifungal medicine. It is used to treat certain kinds of fungal or yeast infections. This medicine may be used for other purposes; ask your health care provider or pharmacist if you have questions. COMMON BRAND NAME(S): Lamisil, Terbinex What should I tell my health care provider before I take this  medicine? They need to know if you have any of these conditions:  drink alcoholic beverages  kidney disease  liver disease  an unusual or allergic reaction to terbinafine, other medicines, foods, dyes, or preservatives  pregnant or trying to get pregnant  breast-feeding How should I use this medicine? Take this medicine by mouth with a full glass of water. Follow the directions on the prescription label. You can take this medicine with food or on an empty stomach. Take your medicine at regular intervals. Do not take your medicine more often than directed. Do not skip doses or stop your medicine early even if you feel better. Do not stop taking except on your doctor's advice. Talk to your pediatrician regarding the use of this medicine in children. Special care may be needed. Overdosage: If you think you have taken too much of this medicine contact a poison control center or emergency room at once. NOTE: This medicine is only for you. Do not share this medicine with others. What if I miss a dose? If you miss a dose, take it as soon as you can. If it is almost time for your next dose, take only that dose. Do not take double or extra doses. What may interact with this medicine? Do not take this medicine with any of the following medications:  thioridazine This  medicine may also interact with the following medications:  beta-blockers  caffeine  cimetidine  cyclosporine  medicines for depression, anxiety, or psychotic disturbances  medicines for fungal infections like fluconazole and ketoconazole  medicines for irregular heartbeat like amiodarone, flecainide and propafenone  rifampin  warfarin This list may not describe all possible interactions. Give your health care provider a list of all the medicines, herbs, non-prescription drugs, or dietary supplements you use. Also tell them if you smoke, drink alcohol, or use illegal drugs. Some items may interact with your  medicine. What should I watch for while using this medicine? Visit your doctor or health care provider regularly. Tell your doctor right away if you have nausea or vomiting, loss of appetite, stomach pain on your right upper side, yellow skin, dark urine, light stools, or are over tired. Some fungal infections need many weeks or months of treatment to cure. If you are taking this medicine for a long time, you will need to have important blood work done. This medicine may cause serious skin reactions. They can happen weeks to months after starting the medicine. Contact your health care provider right away if you notice fevers or flu-like symptoms with a rash. The rash may be red or purple and then turn into blisters or peeling of the skin. Or, you might notice a red rash with swelling of the face, lips or lymph nodes in your neck or under your arms. What side effects may I notice from receiving this medicine? Side effects that you should report to your doctor or health care professional as soon as possible:  allergic reactions like skin rash or hives, swelling of the face, lips, or tongue  changes in vision  dark urine  fever or infection  general ill feeling or flu-like symptoms  light-colored stools  loss of appetite, nausea  rash, fever, and swollen lymph nodes  redness, blistering, peeling or loosening of the skin, including inside the mouth  right upper belly pain  unusually weak or tired  yellowing of the eyes or skin Side effects that usually do not require medical attention (report to your doctor or health care professional if they continue or are bothersome):  changes in taste  diarrhea  hair loss  muscle or joint pain  stomach gas  stomach upset This list may not describe all possible side effects. Call your doctor for medical advice about side effects. You may report side effects to FDA at 1-800-FDA-1088. Where should I keep my medicine? Keep out of the reach of  children. Store at room temperature below 25 degrees C (77 degrees F). Protect from light. Throw away any unused medicine after the expiration date. NOTE: This sheet is a summary. It may not cover all possible information. If you have questions about this medicine, talk to your doctor, pharmacist, or health care provider.  2020 Elsevier/Gold Standard (2018-08-15 15:37:07)  Diabetes Mellitus and Standards of Medical Care Managing diabetes (diabetes mellitus) can be complicated. Your diabetes treatment may be managed by a team of health care providers, including:  A physician who specializes in diabetes (endocrinologist).  A nurse practitioner or physician assistant.  Nurses.  A diet and nutrition specialist (registered dietitian).  A certified diabetes educator (CDE).  An exercise specialist.  A pharmacist.  An eye doctor.  A foot specialist (podiatrist).  A dentist.  A primary care provider.  A mental health provider. Your health care providers follow guidelines to help you get the best quality of care. The following  schedule is a general guideline for your diabetes management plan. Your health care providers may give you more specific instructions. Physical exams Upon being diagnosed with diabetes mellitus, and each year after that, your health care provider will ask about your medical and family history. He or she will also do a physical exam. Your exam may include:  Measuring your height, weight, and body mass index (BMI).  Checking your blood pressure. This will be done at every routine medical visit. Your target blood pressure may vary depending on your medical conditions, your age, and other factors.  Thyroid gland exam.  Skin exam.  Screening for damage to your nerves (peripheral neuropathy). This may include checking the pulse in your legs and feet and checking the level of sensation in your hands and feet.  A complete foot exam to inspect the structure and skin  of your feet, including checking for cuts, bruises, redness, blisters, sores, or other problems.  Screening for blood vessel (vascular) problems, which may include checking the pulse in your legs and feet and checking your temperature. Blood tests Depending on your treatment plan and your personal needs, you may have the following tests done:  HbA1c (hemoglobin A1c). This test provides information about blood sugar (glucose) control over the previous 2-3 months. It is used to adjust your treatment plan, if needed. This test will be done: ? At least 2 times a year, if you are meeting your treatment goals. ? 4 times a year, if you are not meeting your treatment goals or if treatment goals have changed.  Lipid testing, including total, LDL, and HDL cholesterol and triglyceride levels. ? The goal for LDL is less than 100 mg/dL (5.5 mmol/L). If you are at high risk for complications, the goal is less than 70 mg/dL (3.9 mmol/L). ? The goal for HDL is 40 mg/dL (2.2 mmol/L) or higher for men and 50 mg/dL (2.8 mmol/L) or higher for women. An HDL cholesterol of 60 mg/dL (3.3 mmol/L) or higher gives some protection against heart disease. ? The goal for triglycerides is less than 150 mg/dL (8.3 mmol/L).  Liver function tests.  Kidney function tests.  Thyroid function tests. Dental and eye exams  Visit your dentist two times a year.  If you have type 1 diabetes, your health care provider may recommend an eye exam 3-5 years after you are diagnosed, and then once a year after your first exam. ? For children with type 1 diabetes, a health care provider may recommend an eye exam when your child is age 67 or older and has had diabetes for 3-5 years. After the first exam, your child should get an eye exam once a year.  If you have type 2 diabetes, your health care provider may recommend an eye exam as soon as you are diagnosed, and then once a year after your first exam. Immunizations   The yearly flu  (influenza) vaccine is recommended for everyone 6 months or older who has diabetes.  The pneumonia (pneumococcal) vaccine is recommended for everyone 2 years or older who has diabetes. If you are 56 or older, you may get the pneumonia vaccine as a series of two separate shots.  The hepatitis B vaccine is recommended for adults shortly after being diagnosed with diabetes.  Adults and children with diabetes should receive all other vaccines according to age-specific recommendations from the Centers for Disease Control and Prevention (CDC). Mental and emotional health Screening for symptoms of eating disorders, anxiety, and depression is recommended  at the time of diagnosis and afterward as needed. If your screening shows that you have symptoms (positive screening result), you may need more evaluation and you may work with a mental health care provider. Treatment plan Your treatment plan will be reviewed at every medical visit. You and your health care provider will discuss:  How you are taking your medicines, including insulin.  Any side effects you are experiencing.  Your blood glucose target goals.  The frequency of your blood glucose monitoring.  Lifestyle habits, such as activity level as well as tobacco, alcohol, and substance use. Diabetes self-management education Your health care provider will assess how well you are monitoring your blood glucose levels and whether you are taking your insulin correctly. He or she may refer you to:  A certified diabetes educator to manage your diabetes throughout your life, starting at diagnosis.  A registered dietitian who can create or review your personal nutrition plan.  An exercise specialist who can discuss your activity level and exercise plan. Summary  Managing diabetes (diabetes mellitus) can be complicated. Your diabetes treatment may be managed by a team of health care providers.  Your health care providers follow guidelines in order  to help you get the best quality of care.  Standards of care including having regular physical exams, blood tests, blood pressure monitoring, immunizations, screening tests, and education about how to manage your diabetes.  Your health care providers may also give you more specific instructions based on your individual health. This information is not intended to replace advice given to you by your health care provider. Make sure you discuss any questions you have with your health care provider. Document Revised: 01/24/2018 Document Reviewed: 02/03/2016 Elsevier Patient Education  [place].

## 2019-06-06 ENCOUNTER — Other Ambulatory Visit: Payer: Self-pay | Admitting: Adult Health

## 2019-06-06 LAB — IRON, TOTAL/TOTAL IRON BINDING CAP
%SAT: 45 % (calc) (ref 20–48)
Iron: 159 ug/dL (ref 50–180)
TIBC: 350 mcg/dL (calc) (ref 250–425)

## 2019-06-06 LAB — LIPID PANEL
Cholesterol: 226 mg/dL — ABNORMAL HIGH (ref <200)
HDL: 67 mg/dL (ref >=40)
LDL Cholesterol (Calc): 135 mg/dL (calc) — ABNORMAL HIGH
Non-HDL Cholesterol (Calc): 159 mg/dL (calc) — ABNORMAL HIGH (ref <130)
Total CHOL/HDL Ratio: 3.4 (calc) (ref <5.0)
Triglycerides: 128 mg/dL (ref <150)

## 2019-06-06 LAB — COMPLETE METABOLIC PANEL WITH GFR
AG Ratio: 1.7 (calc) (ref 1.0–2.5)
ALT: 35 U/L (ref 9–46)
AST: 17 U/L (ref 10–35)
Albumin: 4.5 g/dL (ref 3.6–5.1)
Alkaline phosphatase (APISO): 110 U/L (ref 35–144)
BUN: 23 mg/dL (ref 7–25)
CO2: 28 mmol/L (ref 20–32)
Calcium: 9.3 mg/dL (ref 8.6–10.3)
Chloride: 99 mmol/L (ref 98–110)
Creat: 1.16 mg/dL (ref 0.70–1.33)
GFR, Est African American: 81 mL/min/{1.73_m2} (ref >=60)
GFR, Est Non African American: 70 mL/min/{1.73_m2} (ref >=60)
Globulin: 2.6 g/dL (calc) (ref 1.9–3.7)
Glucose, Bld: 314 mg/dL — ABNORMAL HIGH (ref 65–99)
Potassium: 4.2 mmol/L (ref 3.5–5.3)
Sodium: 136 mmol/L (ref 135–146)
Total Bilirubin: 0.9 mg/dL (ref 0.2–1.2)
Total Protein: 7.1 g/dL (ref 6.1–8.1)

## 2019-06-06 LAB — CBC WITH DIFFERENTIAL/PLATELET
Absolute Monocytes: 1038 cells/uL — ABNORMAL HIGH (ref 200–950)
Basophils Absolute: 69 cells/uL (ref 0–200)
Basophils Relative: 0.4 %
Eosinophils Absolute: 52 cells/uL (ref 15–500)
Eosinophils Relative: 0.3 %
HCT: 47.7 % (ref 38.5–50.0)
Hemoglobin: 16.1 g/dL (ref 13.2–17.1)
Lymphs Abs: 1817 cells/uL (ref 850–3900)
MCH: 30.9 pg (ref 27.0–33.0)
MCHC: 33.8 g/dL (ref 32.0–36.0)
MCV: 91.6 fL (ref 80.0–100.0)
MPV: 11.9 fL (ref 7.5–12.5)
Monocytes Relative: 6 %
Neutro Abs: 14324 cells/uL — ABNORMAL HIGH (ref 1500–7800)
Neutrophils Relative %: 82.8 %
Platelets: 217 10*3/uL (ref 140–400)
RBC: 5.21 10*6/uL (ref 4.20–5.80)
RDW: 12.4 % (ref 11.0–15.0)
Total Lymphocyte: 10.5 %
WBC: 17.3 10*3/uL — ABNORMAL HIGH (ref 3.8–10.8)

## 2019-06-06 LAB — URINALYSIS, ROUTINE W REFLEX MICROSCOPIC
Bilirubin Urine: NEGATIVE
Hgb urine dipstick: NEGATIVE
Ketones, ur: NEGATIVE
Leukocytes,Ua: NEGATIVE
Nitrite: NEGATIVE
Protein, ur: NEGATIVE
Specific Gravity, Urine: 1.037 — ABNORMAL HIGH (ref 1.001–1.03)
pH: 5.5 (ref 5.0–8.0)

## 2019-06-06 LAB — MICROALBUMIN / CREATININE URINE RATIO
Creatinine, Urine: 198 mg/dL (ref 20–320)
Microalb Creat Ratio: 7 mcg/mg creat (ref <30)
Microalb, Ur: 1.3 mg/dL

## 2019-06-06 LAB — HEMOGLOBIN A1C
Hgb A1c MFr Bld: 9.6 % of total Hgb — ABNORMAL HIGH (ref <5.7)
Mean Plasma Glucose: 229 (calc)
eAG (mmol/L): 12.7 (calc)

## 2019-06-06 LAB — TSH: TSH: 1.76 mIU/L (ref 0.40–4.50)

## 2019-06-06 LAB — PSA: PSA: 0.6 ng/mL (ref <=4.0)

## 2019-06-06 LAB — MAGNESIUM: Magnesium: 1.9 mg/dL (ref 1.5–2.5)

## 2019-06-06 LAB — VITAMIN D 25 HYDROXY (VIT D DEFICIENCY, FRACTURES): Vit D, 25-Hydroxy: 14 ng/mL — ABNORMAL LOW (ref 30–100)

## 2019-06-06 MED ORDER — ROSUVASTATIN CALCIUM 20 MG PO TABS: 20.0000 mg | ORAL_TABLET | Freq: Every day | ORAL | 1 refills | Status: DC

## 2019-06-08 ENCOUNTER — Other Ambulatory Visit: Payer: Self-pay | Admitting: Adult Health

## 2019-06-08 DIAGNOSIS — D72829 Elevated white blood cell count, unspecified: Secondary | ICD-10-CM

## 2019-06-08 DIAGNOSIS — R0602 Shortness of breath: Secondary | ICD-10-CM

## 2019-06-10 ENCOUNTER — Ambulatory Visit: Payer: Managed Care, Other (non HMO) | Attending: Internal Medicine

## 2019-06-10 DIAGNOSIS — Z20822 Contact with and (suspected) exposure to covid-19: Secondary | ICD-10-CM

## 2019-06-11 LAB — NOVEL CORONAVIRUS, NAA: SARS-CoV-2, NAA: NOT DETECTED

## 2019-06-15 ENCOUNTER — Ambulatory Visit
Admission: RE | Admit: 2019-06-15 | Discharge: 2019-06-15 | Disposition: A | Discharge status: Home / Self Care | Payer: Managed Care, Other (non HMO) | Source: Ambulatory Visit | Attending: Adult Health | Admitting: Adult Health

## 2019-06-15 DIAGNOSIS — R0602 Shortness of breath: Secondary | ICD-10-CM

## 2019-06-15 DIAGNOSIS — D72829 Elevated white blood cell count, unspecified: Secondary | ICD-10-CM

## 2019-06-22 ENCOUNTER — Telehealth: Payer: Self-pay

## 2019-06-22 NOTE — Telephone Encounter (Signed)
Patient states that he has a cough with congestion, some difficulty breathing, occasional headache, lack of appetite. Symptoms began on 06/15/19 and had a fever for several days. Currently taking Mucinex and Sudafed without relief. Would like to be seen but advised to go get tested for Covid at an Urgent Care Facility for rapid results.

## 2019-07-02 NOTE — Progress Notes (Signed)
Diabetes Education and Follow-Up Visit  59 y.o.male presents for diabetic education. He has Diabetes Mellitus type 2:  with diabetic chronic kidney disease, he is on bASA, and denies hypoglycemia , increased appetite, nausea, paresthesia of the feet, polydipsia, polyuria, visual disturbances and weight loss.  He was diagnosed with covid 75 mid Jan 2021, was treated with outpatient supportive treatment, negative CXR on 06/15/2019, had negative follow up testing on 06/29/2019, has been back to work and reports feeling well.   Last hemoglobin A1c was: Lab Results  Component Value Date   HGBA1C 9.6 (H) 06/05/2019   HGBA1C 7.3 (H) 02/28/2018   HGBA1C 6.1 (H) 08/09/2017    BMI is Body mass index is 32.56 kg/m., he has been working on diet and exercise, has been working on a keto type diet, lots of lean protein and doing shakes meal replacements, reports has successfully lost significant weight and improved glucose  Wt Readings from Last 3 Encounters:  07/03/19 264 lb (119.7 kg)  06/05/19 273 lb (123.8 kg)  01/28/19 273 lb (123.8 kg)    Pt is on a regimen of: Metformin- currently off due to illness but will be reintroducing slowly, tolerated 1 tab with large meals, plan to gradually   Pt checks his sugars 1 x day  but having error messages and doesn't have a log today  Lowest sugar was .  He denies hypoglycemic symptoms  Highest sugar was .  Glucometer: ? [employer]   Exercise: works very physical job, walking and lifting 11 hour shifts minimal in past 3 weeks due to exercise  His blood pressure has been controlled at home (111/80-140/90), today their BP is BP: 140/90 He denies chest pain, shortness of breath, dizziness.  Patient does have CKD He is on ACE/ARB  Lab Results  Component Value Date   GFRNONAA 70 06/05/2019    Lab Results  Component Value Date   CREATININE 1.16 06/05/2019   BUN 23 06/05/2019   NA 136 06/05/2019   K 4.2 06/05/2019   CL 99 06/05/2019   CO2 28  06/05/2019    Lab Results  Component Value Date   MICROALBUR 1.3 06/05/2019    He is on a Statin, newly prescribed rosuvastatin 20 mg. Hasn't picked up due to illness. Refilled.   He is not at goal of less than 70.  Lab Results  Component Value Date   CHOL 226 (H) 06/05/2019   HDL 67 06/05/2019   LDLCALC 135 (H) 06/05/2019   TRIG 128 06/05/2019   CHOLHDL 3.4 06/05/2019   Diabetic eye exam: will schedule  Pneumonia vaccine: declines today    Problem List has Hypertension; Arthritis; Allergic rhinitis; Hyperlipidemia associated with type 2 diabetes mellitus ([place]); Obesity (BMI 30.0-34.9); Type 2 diabetes mellitus with hyperglycemia ([place]); Primary osteoarthritis of both knees; Arthrosis of right acromioclavicular joint; CKD stage 2 due to type 2 diabetes mellitus ([place]); and History of colon polyps on their problem list.  Medications Current Outpatient Medications on File Prior to Visit  Medication Sig  . albuterol (VENTOLIN HFA) 108 (90 Base) MCG/ACT inhaler Inhale 2 puffs into the lungs every 4 (four) hours as needed for wheezing or shortness of breath.  Marland Kitchen aspirin EC 81 MG tablet Take 81 mg by mouth daily.  Marland Kitchen atenolol (TENORMIN) 100 MG tablet Take 1 tablet daily for BP  . Blood Glucose Monitoring Suppl DEVI Check blood sugar fasting in am, in the evening and when feeling bad as needed.  Marland Kitchen glucose blood test strip  Use as instructed  . hydrochlorothiazide (HYDRODIURIL) 25 MG tablet Take 1/2-1 tablet daily for BP and fluid, for goal <130/80.  Marland Kitchen Lancets (ACCU-CHEK SOFT TOUCH) lancets Use as instructed  . Multiple Vitamins-Minerals (MULTIVITAMIN WITH MINERALS) tablet Take 1 tablet by mouth daily.  Marland Kitchen OVER THE COUNTER MEDICATION   . telmisartan (MICARDIS) 40 MG tablet Take 1 tablet daily for BP  . tiZANidine (ZANAFLEX) 4 MG tablet Take 1 tablet (4 mg total) by mouth every 8 (eight) hours as needed for muscle spasms.  . metFORMIN (GLUCOPHAGE XR) 500 MG 24 hr tablet Take 1 tablet by mouth  daily with biggest meal for Diabetes (Patient not taking: Reported on 06/05/2019)  . rosuvastatin (CRESTOR) 20 MG tablet Take 1 tablet (20 mg total) by mouth at bedtime. (Patient not taking: Reported on 07/03/2019)   No current facility-administered medications on file prior to visit.    ROS- see HPI  Physical Exam: Blood pressure 140/90, pulse 75, temperature 97.9 F (36.6 C), weight 264 lb (119.7 kg), SpO2 97 %. Body mass index is 32.56 kg/m. General Appearance: Well nourished, in no apparent distress. Eyes: PERRLA, EOMs, conjunctiva no swelling or erythema ENT/Mouth: Ext aud canals clear, TMs without erythema, bulging. Mask in place; oral exam deferred. Hearing normal.  Respiratory: Respiratory effort normal, BS equal bilaterally without rales, rhonchi, wheezing or stridor.  Cardio: RRR with no MRGs. Brisk peripheral pulses without edema.  Abdomen: Soft, + BS.  Non tender, no guarding, rebound, hernias, masses. Musculoskeletal: Full ROM, 5/5 strength, normal gait.  Skin: Warm, dry without rashes, lesions, ecchymosis.  Neuro: Cranial nerves intact. Normal muscle tone, no cerebellar symptoms. Sensation intact.    Plan and Assessment: Diabetes Education: Reviewed 'ABCs' of diabetes management (respective goals in parentheses):  A1C (<7), blood pressure (<130/80), and cholesterol (LDL <70) Eye Exam yearly and Dental Exam every 6 months. Dietary recommendations - continue low carb diet with weight loss focus - carb content reviewed, information given  Physical Activity recommendations - physically intense job - Strongly advised him to start checking sugars - daily fasting, keep a log, goal <130 - given sugar log and advised how to fill it and to bring it at next appt  - If he continues to have problems with glucometer (new) schedule NV for technique review - pick up and start rosuvastatin  - gradually restart metformin - 1 tab with largest meal of the day, then gradually increase up as  tolerated to max 4 tabs/day with meals if possible. Mild GI sx not uncommon; typically improve after 2 weeks. Encouraged imodium for mild diarrhea - add cinnamon 500-100 mg BID as tolerated  - Message or call sooner if questions or concerns    Future Appointments  Date Time Provider [place]  09/04/2019  8:45 AM Liane Comber, NP [facility] None  12/04/2019  8:45 AM Liane Comber, NP [facility] None  06/06/2020 10:00 AM Vicie Mutters, PA-C [facility] None

## 2019-07-03 ENCOUNTER — Ambulatory Visit (INDEPENDENT_AMBULATORY_CARE_PROVIDER_SITE_OTHER): Payer: Managed Care, Other (non HMO) | Admitting: Adult Health

## 2019-07-03 ENCOUNTER — Other Ambulatory Visit: Payer: Self-pay

## 2019-07-03 ENCOUNTER — Encounter: Payer: Self-pay | Admitting: Adult Health

## 2019-07-03 VITALS — BP 140/90 | HR 75 | Temp 97.9°F | Wt 264.0 lb

## 2019-07-03 DIAGNOSIS — E1165 Type 2 diabetes mellitus with hyperglycemia: Secondary | ICD-10-CM

## 2019-07-03 DIAGNOSIS — E669 Obesity, unspecified: Secondary | ICD-10-CM

## 2019-07-03 DIAGNOSIS — E1122 Type 2 diabetes mellitus with diabetic chronic kidney disease: Secondary | ICD-10-CM | POA: Diagnosis not present

## 2019-07-03 DIAGNOSIS — E1169 Type 2 diabetes mellitus with other specified complication: Secondary | ICD-10-CM

## 2019-07-03 DIAGNOSIS — E785 Hyperlipidemia, unspecified: Secondary | ICD-10-CM

## 2019-07-03 DIAGNOSIS — I10 Essential (primary) hypertension: Secondary | ICD-10-CM | POA: Diagnosis not present

## 2019-07-03 DIAGNOSIS — N182 Chronic kidney disease, stage 2 (mild): Secondary | ICD-10-CM

## 2019-07-03 MED ORDER — ROSUVASTATIN CALCIUM 20 MG PO TABS: 20.0000 mg | ORAL_TABLET | Freq: Every day | ORAL | 1 refills | Status: DC

## 2019-07-03 NOTE — Patient Instructions (Addendum)
Goals    . Blood Pressure < 130/80    . Weight (lb) < 250 lb (113.4 kg)       Pick up rosuvastatin  Goal fasting: <130, then long term is <100  Consider cinnamon 1000 mg capsule twice a day    Please schedule colonoscopy after verifying with insurance   Please diabetes eye exam        Bad carbs also include fruit juice, alcohol, and sweet tea. These are empty calories that do not signal to your brain that you are full.   Please remember the good carbs are still carbs which convert into sugar. So please measure them out no more than 1/2-1 cup of rice, oatmeal, pasta, and beans  Veggies are however free foods! Pile them on.   Not all fruit is created equal. Please see the list below, the fruit at the bottom is higher in sugars than the fruit at the top. Please avoid all dried fruits.        Medicines you can use  Nasal congestion  Little Remedies saline spray (aerosol/mist)- can try this, it is in the kids section - pseudoephedrine (Sudafed)- behind the counter, do not use if you have high blood pressure, medicine that have -D in them.  - phenylephrine (Sudafed PE) -Dextormethorphan + chlorpheniramine (Coridcidin HBP)- okay if you have high blood pressure -Oxymetazoline (Afrin) nasal spray- LIMIT to 3 days -Saline nasal spray -Neti pot (used distilled or bottled water)  Ear pain/congestion  -pseudoephedrine (sudafed) - Nasonex/flonase nasal spray  Fever  -Acetaminophen (Tyelnol) -Ibuprofen (Advil, motrin, aleve)  Sore Throat  -Acetaminophen (Tyelnol) -Ibuprofen (Advil, motrin, aleve) -Drink a lot of water -Gargle with salt water - Rest your voice (don't talk) -Throat sprays -Cough drops  Body Aches  -Acetaminophen (Tyelnol) -Ibuprofen (Advil, motrin, aleve)  Headache  -Acetaminophen (Tyelnol) -Ibuprofen (Advil, motrin, aleve) - Exedrin, Exedrin Migraine  Allergy symptoms (cough, sneeze, runny nose, itchy eyes) -Claritin or loratadine cheapest but  likely the weakest  -Zyrtec or certizine at night because it can make you sleepy -The strongest is allegra or fexafinadine  Cheapest at [employer], [employer], [employer]  Cough  -Dextromethorphan (Delsym)- medicine that has DM in it -Guafenesin (Mucinex/Robitussin) - cough drops - drink lots of water  Chest Congestion  -Guafenesin (Mucinex/Robitussin)  Red Itchy Eyes  - Naphcon-A  Upset Stomach  - Bland diet (nothing spicy, greasy, fried, and high acid foods like tomatoes, oranges, berries) -OKAY- cereal, bread, soup, crackers, rice -Eat smaller more frequent meals -reduce caffeine, no alcohol -Loperamide (Imodium-AD) if diarrhea -Prevacid for heart burn  General health when sick  -Hydration -wash your hands frequently -keep surfaces clean -change pillow cases and sheets often -Get fresh air but do not exercise strenuously -Vitamin D, double up on it - Vitamin C -Zinc

## 2019-07-17 ENCOUNTER — Other Ambulatory Visit: Payer: Self-pay | Admitting: *Deleted

## 2019-07-17 DIAGNOSIS — E1165 Type 2 diabetes mellitus with hyperglycemia: Secondary | ICD-10-CM

## 2019-07-17 MED ORDER — METFORMIN HCL ER 500 MG PO TB24: ORAL_TABLET | ORAL | 11 refills | Status: DC

## 2019-07-30 ENCOUNTER — Encounter: Payer: Self-pay | Admitting: Adult Health

## 2019-09-02 NOTE — Progress Notes (Signed)
FOLLOW UP  Assessment and Plan:   Hypertension Above goal: increase HCTZ to 25 mg daily, if persistently above goal increase telmisartan to 80 mg; follow up 4 weeks  Monitor blood pressure at home; patient to call if consistently greater than 130/80 Continue DASH diet.   Reminder to go to the ER if any CP, SOB, nausea, dizziness, severe HA, changes vision/speech, left arm numbness and tingling and jaw pain.  Hyperlipidemia associated with T2DM ([place]) Currently above goal; titrate statin as tolerated, doing well with 20 mg rosuvastatin  LDL goal <70 Continue low cholesterol diet and exercise.  Check lipid panel.   Diabetes with diabetic chronic kidney disease ([place]) Continue medication: metformin - increase gradually to 4 tabs, consider GLP1 inhibitor for weight loss Continue diet and exercise.  Perform daily foot/skin check, notify office of any concerning changes.  Check A1C  CKD 2 associated with T2DM ([facility]) Increase fluids, avoid NSAIDS, monitor sugars, will monitor  Obesity with co morbidities Long discussion about weight loss, diet, and exercise Recommended diet heavy in fruits and veggies and low in animal meats, cheeses, and dairy products, appropriate calorie intake Discussed ideal weight for height  Will follow up in 3 months  Vitamin D Def Below goal at last visit; has increased to 10000 IU daily  continue supplementation to maintain goal of 70-100 Check Vit D level  Continue diet and meds as discussed. Further disposition pending results of labs. Discussed med's effects and SE's.   Over 30 minutes of exam, counseling, chart review, and critical decision making was performed.   Future Appointments  Date Time Provider [place]  12/04/2019  8:45 AM Liane Comber, NP [facility] None  06/06/2020 10:00 AM Vicie Mutters, PA-C [facility] None     ----------------------------------------------------------------------------------------------------------------------  HPI 59 y.o. male  presents for 3 month follow up on hypertension, cholesterol, diabetes with CKD, weight and vitamin D deficiency.   Stressed - Brother passed away last week from complications with Crohn's, daughter got married this past weekend.   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 He has been trying to work on [employer], has set a personal goal of 180 lb.  Wt Readings from Last 3 Encounters:  09/04/19 257 lb (116.6 kg)  07/03/19 264 lb (119.7 kg)  06/05/19 273 lb (123.8 kg)   His blood pressure has not been controlled at home (110-140s, 80-90s), today their BP is BP: (!) 142/94 Taking atenolol 100 mg, telmisartan 40 mg, HCTZ 12.5 mg.   He does not workout. He denies chest pain, shortness of breath, dizziness.   He is on cholesterol medication Rosuvastatin (increased from 5 mg to 20 mg at last visit) and denies myalgias. His cholesterol is not at goal. The cholesterol last visit was:   Lab Results  Component Value Date   CHOL 226 (H) 06/05/2019   HDL 67 06/05/2019   LDLCALC 135 (H) 06/05/2019   TRIG 128 06/05/2019   CHOLHDL 3.4 06/05/2019    He has been working on diet and exercise for T2 diabetes, previously well controlled on metformin 500 mg 2 tabs daily, working up to 4 tabs, has added "berberine" supplement, and denies increased appetite, nausea, paresthesia of the feet, polydipsia, polyuria and visual disturbances. Last A1C in the office was:  Lab Results  Component Value Date   HGBA1C 9.6 (H) 06/05/2019    He  has CKD II associated with T2DM monitored at this office:  Lab Results  Component Value Date   GFRNONAA 70 06/05/2019   Patient is on Vitamin D supplement, taking 10000 IU daily Lab Results  Component Value Date    VD25OH 14 (L) 06/05/2019        Current Medications:  Current Outpatient Medications on File Prior to Visit  Medication Sig  . albuterol (VENTOLIN HFA) 108 (90 Base) MCG/ACT inhaler Inhale 2 puffs into the lungs every 4 (four) hours as needed for wheezing or shortness of breath.  Marland Kitchen aspirin EC 81 MG tablet Take 81 mg by mouth daily.  Marland Kitchen atenolol (TENORMIN) 100 MG tablet Take 1 tablet daily for BP  . Blood Glucose Monitoring Suppl DEVI Check blood sugar fasting in am, in the evening and when feeling bad as needed.  Marland Kitchen glucose blood test strip Use as instructed  . hydrochlorothiazide (HYDRODIURIL) 25 MG tablet Take 1/2-1 tablet daily for BP and fluid, for goal <130/80.  Marland Kitchen Lancets (ACCU-CHEK SOFT TOUCH) lancets Use as instructed  . metFORMIN (GLUCOPHAGE XR) 500 MG 24 hr tablet Take 1 tablet by mouth daily with biggest meal for Diabetes  . Multiple Vitamins-Minerals (MULTIVITAMIN WITH MINERALS) tablet Take 1 tablet by mouth daily.  Marland Kitchen OVER THE COUNTER MEDICATION   . rosuvastatin (CRESTOR) 20 MG tablet Take 1 tablet (20 mg total) by mouth at bedtime.  Marland Kitchen telmisartan (MICARDIS) 40 MG tablet Take 1 tablet daily for BP  . tiZANidine (ZANAFLEX) 4 MG tablet Take 1 tablet (4 mg total) by mouth every 8 (eight) hours as needed for muscle spasms. (Patient not taking: Reported on 09/04/2019)   No current facility-administered medications on file prior to visit.     Allergies:  Allergies  Allergen Reactions  . Bee Venom Hives     Medical History:  Past Medical History:  Diagnosis Date  . Allergic rhinitis, cause unspecified   . Arthritis    neck and knees  . Hyperlipidemia   . Hypertension   . Prediabetes    Family history- Reviewed and unchanged Social history- Reviewed and unchanged   Review of Systems:  ROS    Physical Exam: BP (!) 142/94   Pulse 61   Temp 97.9 F (36.6 C)   Wt 257 lb (116.6 kg)   SpO2 98%   BMI 31.70 kg/m  Wt Readings from Last 3 Encounters:  09/04/19 257  lb (116.6 kg)  07/03/19 264 lb (119.7 kg)  06/05/19 273 lb (123.8 kg)   General Appearance: Well nourished, in no apparent distress. Eyes: PERRLA, EOMs, conjunctiva no swelling or erythema Sinuses: No Frontal/maxillary tenderness ENT/Mouth: Ext aud canals clear, TMs without erythema, bulging. No erythema, swelling, or exudate on post pharynx.  Tonsils not swollen or erythematous. Hearing normal.  Neck: Supple, thyroid normal.  Respiratory: Respiratory effort normal, BS equal bilaterally without rales, rhonchi, wheezing or stridor.  Cardio: RRR with no MRGs. Brisk peripheral pulses without edema.  Abdomen: Soft, + BS.  Non tender, no guarding, rebound, hernias, masses. Lymphatics: Non tender without lymphadenopathy.  Musculoskeletal: Full ROM, 5/5 strength, Normal gait Skin: Warm, dry without rashes, lesions, ecchymosis.  Neuro: Cranial nerves intact. No cerebellar symptoms.  Psych: Awake and oriented X 3, normal affect, Insight and Judgment appropriate.    Izora Ribas, NP 8:52 AM [facility]

## 2019-09-04 ENCOUNTER — Encounter: Payer: Self-pay | Admitting: Adult Health

## 2019-09-04 ENCOUNTER — Other Ambulatory Visit: Payer: Self-pay

## 2019-09-04 ENCOUNTER — Ambulatory Visit (INDEPENDENT_AMBULATORY_CARE_PROVIDER_SITE_OTHER): Payer: Managed Care, Other (non HMO) | Admitting: Adult Health

## 2019-09-04 VITALS — BP 142/94 | HR 61 | Temp 97.9°F | Wt 257.0 lb

## 2019-09-04 DIAGNOSIS — E1165 Type 2 diabetes mellitus with hyperglycemia: Secondary | ICD-10-CM | POA: Diagnosis not present

## 2019-09-04 DIAGNOSIS — E669 Obesity, unspecified: Secondary | ICD-10-CM

## 2019-09-04 DIAGNOSIS — Z79899 Other long term (current) drug therapy: Secondary | ICD-10-CM

## 2019-09-04 DIAGNOSIS — E785 Hyperlipidemia, unspecified: Secondary | ICD-10-CM | POA: Diagnosis not present

## 2019-09-04 DIAGNOSIS — I10 Essential (primary) hypertension: Secondary | ICD-10-CM | POA: Diagnosis not present

## 2019-09-04 DIAGNOSIS — N182 Chronic kidney disease, stage 2 (mild): Secondary | ICD-10-CM

## 2019-09-04 DIAGNOSIS — E1169 Type 2 diabetes mellitus with other specified complication: Secondary | ICD-10-CM

## 2019-09-04 DIAGNOSIS — E559 Vitamin D deficiency, unspecified: Secondary | ICD-10-CM

## 2019-09-04 DIAGNOSIS — E1122 Type 2 diabetes mellitus with diabetic chronic kidney disease: Secondary | ICD-10-CM

## 2019-09-04 MED ORDER — METFORMIN HCL ER 500 MG PO TB24: ORAL_TABLET | ORAL | 1 refills | Status: DC

## 2019-09-04 MED ORDER — HYDROCHLOROTHIAZIDE 25 MG PO TABS: ORAL_TABLET | ORAL | 1 refills | Status: DC

## 2019-09-04 MED ORDER — TELMISARTAN 80 MG PO TABS: ORAL_TABLET | ORAL | 1 refills | Status: DC

## 2019-09-04 NOTE — Patient Instructions (Addendum)
Goals    . Blood Pressure < 130/80    . Weight (lb) < 250 lb (113.4 kg)         Read - "Fiber fueled" by Dr. Anabel Bene -   Increase hydrochlorothiazide to full tab - 25 mg  Then, if BP still running above goal in 1 week, increase telmisartan to 80 mg - new dose sent in   Increase metformin gradually to 4 tabs/day as tolerated  Will consider adding trulicity or ozempic - check with insurance about preferred GLP1 medication      HYPERTENSION INFORMATION  Monitor your blood pressure at home, please keep a record and bring that in with you to your next office visit.   Go to the ER if any CP, SOB, nausea, dizziness, severe HA, changes vision/speech  Testing/Procedures: HOW TO TAKE YOUR BLOOD PRESSURE:  Rest 5 minutes before taking your blood pressure.  Don't smoke or drink caffeinated beverages for at least 30 minutes before.  Take your blood pressure before (not after) you eat.  Sit comfortably with your back supported and both feet on the floor (don't cross your legs).  Elevate your arm to heart level on a table or a desk.  Use the proper sized cuff. It should fit smoothly and snugly around your bare upper arm. There should be enough room to slip a fingertip under the cuff. The bottom edge of the cuff should be 1 inch above the crease of the elbow.  Due to a recent study, SPRINT, we have changed our goal for the systolic or top blood pressure number. Ideally we want your top number at 120.  In the [facility] Trial, 5000 people were randomized to a goal BP of 120 and 5000 people were randomized to a goal BP of less than 140. The patients with the goal BP at 120 had LESS DEMENTIA, LESS HEART ATTACKS, AND LESS STROKES, AS WELL AS OVERALL DECREASED MORTALITY OR DEATH RATE.   There was another study that showed taking your blood pressure medications at night decrease cardiovascular events.  However if you are on a fluid pill, please take this in the morning.   If you are  willing, our goal BP is the top number of 120.  Your most recent BP: BP: (!) 142/94   Take your medications faithfully as instructed. Maintain a healthy weight. Get at least 150 minutes of aerobic exercise per week. Minimize salt intake. Minimize alcohol intake  DASH Eating Plan DASH stands for "Dietary Approaches to Stop Hypertension." The DASH eating plan is a healthy eating plan that has been shown to reduce high blood pressure (hypertension). Additional health benefits may include reducing the risk of type 2 diabetes mellitus, heart disease, and stroke. The DASH eating plan may also help with weight loss. WHAT DO I NEED TO KNOW ABOUT THE DASH EATING PLAN? For the DASH eating plan, you will follow these general guidelines:  Choose foods with a percent daily value for sodium of less than 5% (as listed on the food label).  Use salt-free seasonings or herbs instead of table salt or sea salt.  Check with your health care provider or pharmacist before using salt substitutes.  Eat lower-sodium products, often labeled as "lower sodium" or "no salt added."  Eat fresh foods.  Eat more vegetables, fruits, and low-fat dairy products.  Choose whole grains. Look for the word "whole" as the first word in the ingredient list.  Choose fish and skinless chicken or [place] more often than  red meat. Limit fish, poultry, and meat to 6 oz (170 g) each day.  Limit sweets, desserts, sugars, and sugary drinks.  Choose heart-healthy fats.  Limit cheese to 1 oz (28 g) per day.  Eat more home-cooked food and less restaurant, buffet, and fast food.  Limit fried foods.  Cook foods using methods other than frying.  Limit canned vegetables. If you do use them, rinse them well to decrease the sodium.  When eating at a restaurant, ask that your food be prepared with less salt, or no salt if possible. WHAT FOODS CAN I EAT? Seek help from a dietitian for individual calorie needs. Grains Whole grain or  whole wheat bread. Brown rice. Whole grain or whole wheat pasta. Quinoa, bulgur, and whole grain cereals. Low-sodium cereals. Corn or whole wheat flour tortillas. Whole grain cornbread. Whole grain crackers. Low-sodium crackers. Vegetables Fresh or frozen vegetables (raw, steamed, roasted, or grilled). Low-sodium or reduced-sodium tomato and vegetable juices. Low-sodium or reduced-sodium tomato sauce and paste. Low-sodium or reduced-sodium canned vegetables.  Fruits All fresh, canned (in natural juice), or frozen fruits. Meat and Other Protein Products Ground beef (85% or leaner), grass-fed beef, or beef trimmed of fat. Skinless chicken or [place]. Ground chicken or [place]. Pork trimmed of fat. All fish and seafood. Eggs. Dried beans, peas, or lentils. Unsalted nuts and seeds. Unsalted canned beans. Dairy Low-fat dairy products, such as skim or 1% milk, 2% or reduced-fat cheeses, low-fat ricotta or cottage cheese, or plain low-fat yogurt. Low-sodium or reduced-sodium cheeses. Fats and Oils Tub margarines without trans fats. Light or reduced-fat mayonnaise and salad dressings (reduced sodium). Avocado. Safflower, olive, or canola oils. Natural peanut or almond butter. Other Unsalted popcorn and pretzels. The items listed above may not be a complete list of recommended foods or beverages. Contact your dietitian for more options. WHAT FOODS ARE NOT RECOMMENDED? Grains White bread. White pasta. White rice. Refined cornbread. Bagels and croissants. Crackers that contain trans fat. Vegetables Creamed or fried vegetables. Vegetables in a cheese sauce. Regular canned vegetables. Regular canned tomato sauce and paste. Regular tomato and vegetable juices. Fruits Dried fruits. Canned fruit in light or heavy syrup. Fruit juice. Meat and Other Protein Products Fatty cuts of meat. Ribs, chicken wings, bacon, sausage, bologna, salami, chitterlings, fatback, hot dogs, bratwurst, and packaged luncheon meats.  Salted nuts and seeds. Canned beans with salt. Dairy Whole or 2% milk, cream, half-and-half, and cream cheese. Whole-fat or sweetened yogurt. Full-fat cheeses or blue cheese. Nondairy creamers and whipped toppings. Processed cheese, cheese spreads, or cheese curds. Condiments Onion and garlic salt, seasoned salt, table salt, and sea salt. Canned and packaged gravies. Worcestershire sauce. Tartar sauce. Barbecue sauce. Teriyaki sauce. Soy sauce, including reduced sodium. Steak sauce. Fish sauce. Oyster sauce. Cocktail sauce. Horseradish. Ketchup and mustard. Meat flavorings and tenderizers. Bouillon cubes. Hot sauce. Tabasco sauce. Marinades. Taco seasonings. Relishes. Fats and Oils Butter, stick margarine, lard, shortening, ghee, and bacon fat. Coconut, palm kernel, or palm oils. Regular salad dressings. Other Pickles and olives. Salted popcorn and pretzels. The items listed above may not be a complete list of foods and beverages to avoid. Contact your dietitian for more information. WHERE CAN I FIND MORE INFORMATION? National Heart, Lung, and Blood Institute: travelstabloid.com Document Released: 04/26/2011 Document Revised: 09/21/2013 Document Reviewed: 03/11/2013 [facility] Patient Information 2015 [place], [place]. This information is not intended to replace advice given to you by your health care provider. Make sure you discuss any questions you have with your health care  provider.

## 2019-09-05 LAB — VITAMIN D 25 HYDROXY (VIT D DEFICIENCY, FRACTURES): Vit D, 25-Hydroxy: 37 ng/mL (ref 30–100)

## 2019-09-05 LAB — HEMOGLOBIN A1C
Hgb A1c MFr Bld: 7.1 % of total Hgb — ABNORMAL HIGH (ref <5.7)
Mean Plasma Glucose: 157 (calc)
eAG (mmol/L): 8.7 (calc)

## 2019-09-05 LAB — CBC WITH DIFFERENTIAL/PLATELET
Absolute Monocytes: 665 cells/uL (ref 200–950)
Basophils Absolute: 49 cells/uL (ref 0–200)
Basophils Relative: 0.8 %
Eosinophils Absolute: 201 cells/uL (ref 15–500)
Eosinophils Relative: 3.3 %
HCT: 42.6 % (ref 38.5–50.0)
Hemoglobin: 14.1 g/dL (ref 13.2–17.1)
Lymphs Abs: 1373 cells/uL (ref 850–3900)
MCH: 31.5 pg (ref 27.0–33.0)
MCHC: 33.1 g/dL (ref 32.0–36.0)
MCV: 95.3 fL (ref 80.0–100.0)
MPV: 11.7 fL (ref 7.5–12.5)
Monocytes Relative: 10.9 %
Neutro Abs: 3813 cells/uL (ref 1500–7800)
Neutrophils Relative %: 62.5 %
Platelets: 178 10*3/uL (ref 140–400)
RBC: 4.47 10*6/uL (ref 4.20–5.80)
RDW: 12.3 % (ref 11.0–15.0)
Total Lymphocyte: 22.5 %
WBC: 6.1 10*3/uL (ref 3.8–10.8)

## 2019-09-05 LAB — COMPLETE METABOLIC PANEL WITH GFR
AG Ratio: 1.9 (calc) (ref 1.0–2.5)
ALT: 24 U/L (ref 9–46)
AST: 22 U/L (ref 10–35)
Albumin: 4.4 g/dL (ref 3.6–5.1)
Alkaline phosphatase (APISO): 69 U/L (ref 35–144)
BUN: 25 mg/dL (ref 7–25)
CO2: 24 mmol/L (ref 20–32)
Calcium: 9.1 mg/dL (ref 8.6–10.3)
Chloride: 106 mmol/L (ref 98–110)
Creat: 1.02 mg/dL (ref 0.70–1.33)
GFR, Est African American: 95 mL/min/{1.73_m2} (ref >=60)
GFR, Est Non African American: 82 mL/min/{1.73_m2} (ref >=60)
Globulin: 2.3 g/dL (calc) (ref 1.9–3.7)
Glucose, Bld: 172 mg/dL — ABNORMAL HIGH (ref 65–99)
Potassium: 5 mmol/L (ref 3.5–5.3)
Sodium: 138 mmol/L (ref 135–146)
Total Bilirubin: 0.7 mg/dL (ref 0.2–1.2)
Total Protein: 6.7 g/dL (ref 6.1–8.1)

## 2019-09-05 LAB — LIPID PANEL
Cholesterol: 111 mg/dL (ref <200)
HDL: 46 mg/dL (ref >=40)
LDL Cholesterol (Calc): 52 mg/dL (calc)
Non-HDL Cholesterol (Calc): 65 mg/dL (calc) (ref <130)
Total CHOL/HDL Ratio: 2.4 (calc) (ref <5.0)
Triglycerides: 45 mg/dL (ref <150)

## 2019-09-05 LAB — TSH: TSH: 1.55 mIU/L (ref 0.40–4.50)

## 2019-09-05 LAB — MAGNESIUM: Magnesium: 2.1 mg/dL (ref 1.5–2.5)

## 2019-09-11 LAB — HM DIABETES EYE EXAM

## 2019-09-29 ENCOUNTER — Encounter: Payer: Self-pay | Admitting: *Deleted

## 2019-10-01 NOTE — Progress Notes (Signed)
Assessment and Plan:  Diontre was seen today for follow-up.  Diagnoses and all orders for this visit:  Essential hypertension Continue atenolol 100 mg, telmisartan 80 mg, increase HCTZ to 25 mg  Check BPs at various times of the day, keep a log and send in 10-14 days via mychart Consider switch from HCTZ to chlorthalidone if remains above goal, alternately consider adding amlodipine Monitor blood pressure at home; goal < 130/80 Continue DASH diet.   Reminder to go to the ER if any CP, SOB, nausea, dizziness, severe HA, changes vision/speech, left arm numbness and tingling and jaw pain.  Further disposition pending results of labs. Discussed med's effects and SE's.   Over 15 minutes of exam, counseling, chart review, and critical decision making was performed.   Future Appointments  Date Time Provider [place]  12/04/2019  8:45 AM Liane Comber, NP [facility] None  06/06/2020 10:00 AM Vicie Mutters, PA-C [facility] None    ------------------------------------------------------------------------------------------------------------------   HPI BP 140/86   Pulse (!) 57   Temp 97.8 F (36.6 C)   Resp 16   Ht 6' 3.5" (1.918 m)   Wt 258 lb 12.8 oz (117.4 kg)   SpO2 95%   BMI 31.92 kg/m   59 y.o.male presents for 1 month follow up on BP.   He was taking atenolol 100 mg, telmisartan 80 mg, HCTZ was added and advised to increase to 25 mg at last visit. Today he reports has been out of atenolol this past week, restarted taking last night. Reports has only been taking 1/2 tab of HCTZ in AMs.   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  He denies chest pain, shortness of breath, dizziness.    Lab Results  Component Value Date   GFRNONAA 82 09/04/2019    Past Medical History:  Diagnosis Date  . Allergic rhinitis, cause unspecified   .  Arthritis    neck and knees  . Hyperlipidemia   . Hypertension   . Prediabetes      Allergies  Allergen Reactions  . Bee Venom Hives    Current Outpatient Medications on File Prior to Visit  Medication Sig  . albuterol (VENTOLIN HFA) 108 (90 Base) MCG/ACT inhaler Inhale 2 puffs into the lungs every 4 (four) hours as needed for wheezing or shortness of breath.  Marland Kitchen aspirin EC 81 MG tablet Take 81 mg by mouth daily.  Marland Kitchen atenolol (TENORMIN) 100 MG tablet Take 1 tablet daily for BP  . Blood Glucose Monitoring Suppl DEVI Check blood sugar fasting in am, in the evening and when feeling bad as needed.  Marland Kitchen glucose blood test strip Use as instructed  . hydrochlorothiazide (HYDRODIURIL) 25 MG tablet Take 1 tablet daily in the morning for BP and fluid, for goal <130/80.  Marland Kitchen Lancets (ACCU-CHEK SOFT TOUCH) lancets Use as instructed  . metFORMIN (GLUCOPHAGE XR) 500 MG 24 hr tablet Take 2 tablet by mouth twice daily for Diabetes  . Multiple Vitamins-Minerals (MULTIVITAMIN WITH MINERALS) tablet Take 1 tablet by mouth daily.  Marland Kitchen OVER THE COUNTER MEDICATION   . rosuvastatin (CRESTOR) 20 MG tablet Take 1 tablet (20 mg total) by mouth at bedtime.  Marland Kitchen telmisartan (MICARDIS) 80 MG tablet Take 1/2-1 tablet daily for BP, goal of <130/80.  Marland Kitchen tiZANidine (ZANAFLEX) 4 MG tablet Take 1 tablet (4 mg total) by mouth every 8 (  eight) hours as needed for muscle spasms.   No current facility-administered medications on file prior to visit.    ROS: all negative except above.   Physical Exam:  BP 140/86   Pulse (!) 57   Temp 97.8 F (36.6 C)   Resp 16   Ht 6' 3.5" (1.918 m)   Wt 258 lb 12.8 oz (117.4 kg)   SpO2 95%   BMI 31.92 kg/m   General Appearance: Well nourished, in no apparent distress. Eyes: PERRLA, EOMs, conjunctiva no swelling or erythema Sinuses: No Frontal/maxillary tenderness ENT/Mouth: Ext aud canals clear, TMs without erythema, bulging. No erythema, swelling, or exudate on post pharynx.  Tonsils  not swollen or erythematous. Hearing normal.  Neck: Supple, thyroid normal.  Respiratory: Respiratory effort normal, BS equal bilaterally without rales, rhonchi, wheezing or stridor.  Cardio: RRR with no MRGs. Brisk peripheral pulses without edema.  Abdomen: Soft, + BS.  Non tender, no guarding, rebound, hernias, masses. Lymphatics: Non tender without lymphadenopathy.  Musculoskeletal: Full ROM, 5/5 strength, normal gait.  Skin: Warm, dry without rashes, lesions, ecchymosis.  Neuro: Cranial nerves intact. Normal muscle tone, no cerebellar symptoms. Sensation intact.  Psych: Awake and oriented X 3, normal affect, Insight and Judgment appropriate.     Izora Ribas, NP 9:12 AM [facility]

## 2019-10-02 ENCOUNTER — Ambulatory Visit (INDEPENDENT_AMBULATORY_CARE_PROVIDER_SITE_OTHER): Payer: Managed Care, Other (non HMO) | Admitting: Adult Health

## 2019-10-02 ENCOUNTER — Encounter: Payer: Self-pay | Admitting: Adult Health

## 2019-10-02 ENCOUNTER — Other Ambulatory Visit: Payer: Self-pay

## 2019-10-02 VITALS — BP 140/86 | HR 57 | Temp 97.8°F | Resp 16 | Ht 75.5 in | Wt 258.8 lb

## 2019-10-02 DIAGNOSIS — I10 Essential (primary) hypertension: Secondary | ICD-10-CM | POA: Diagnosis not present

## 2019-10-02 NOTE — Patient Instructions (Addendum)
Goals    . Blood Pressure < 130/80    . Weight (lb) < 250 lb (113.4 kg)        Please continue with atenolol 100 mg and telmisartan 80 mg   Please increase hydrochlorothiazide to full tab 25 mg in the morning    Please check blood pressures at different times of the day (morning, when you get home from work, bedtime), keep a log - message me in 10-14 days    Please try checking blood sugar at midnight and compare to the fasting number in the morning - compare, if midnight is lower than fasting - means blood sugar is getting too low overnight   If blood sugar is higher than morning - eating too late or too much - try taking a brisk 15-20 min walk after dinner      HYPERTENSION INFORMATION  Monitor your blood pressure at home, please keep a record and bring that in with you to your next office visit.   Go to the ER if any CP, SOB, nausea, dizziness, severe HA, changes vision/speech  Testing/Procedures: HOW TO TAKE YOUR BLOOD PRESSURE:  Rest 5 minutes before taking your blood pressure.  Don't smoke or drink caffeinated beverages for at least 30 minutes before.  Take your blood pressure before (not after) you eat.  Sit comfortably with your back supported and both feet on the floor (don't cross your legs).  Elevate your arm to heart level on a table or a desk.  Use the proper sized cuff. It should fit smoothly and snugly around your bare upper arm. There should be enough room to slip a fingertip under the cuff. The bottom edge of the cuff should be 1 inch above the crease of the elbow.  Your most recent BP: BP: 140/86   Take your medications faithfully as instructed. Maintain a healthy weight. Get at least 150 minutes of aerobic exercise per week. Minimize salt intake. Minimize alcohol intake  DASH Eating Plan DASH stands for "Dietary Approaches to Stop Hypertension." The DASH eating plan is a healthy eating plan that has been shown to reduce high blood pressure  (hypertension). Additional health benefits may include reducing the risk of type 2 diabetes mellitus, heart disease, and stroke. The DASH eating plan may also help with weight loss. WHAT DO I NEED TO KNOW ABOUT THE DASH EATING PLAN? For the DASH eating plan, you will follow these general guidelines:  Choose foods with a percent daily value for sodium of less than 5% (as listed on the food label).  Use salt-free seasonings or herbs instead of table salt or sea salt.  Check with your health care provider or pharmacist before using salt substitutes.  Eat lower-sodium products, often labeled as "lower sodium" or "no salt added."  Eat fresh foods.  Eat more vegetables, fruits, and low-fat dairy products.  Choose whole grains. Look for the word "whole" as the first word in the ingredient list.  Choose fish and skinless chicken or [place] more often than red meat. Limit fish, poultry, and meat to 6 oz (170 g) each day.  Limit sweets, desserts, sugars, and sugary drinks.  Choose heart-healthy fats.  Limit cheese to 1 oz (28 g) per day.  Eat more home-cooked food and less restaurant, buffet, and fast food.  Limit fried foods.  Cook foods using methods other than frying.  Limit canned vegetables. If you do use them, rinse them well to decrease the sodium.  When eating at a restaurant,  ask that your food be prepared with less salt, or no salt if possible. WHAT FOODS CAN I EAT? Seek help from a dietitian for individual calorie needs. Grains Whole grain or whole wheat bread. Brown rice. Whole grain or whole wheat pasta. Quinoa, bulgur, and whole grain cereals. Low-sodium cereals. Corn or whole wheat flour tortillas. Whole grain cornbread. Whole grain crackers. Low-sodium crackers. Vegetables Fresh or frozen vegetables (raw, steamed, roasted, or grilled). Low-sodium or reduced-sodium tomato and vegetable juices. Low-sodium or reduced-sodium tomato sauce and paste. Low-sodium or  reduced-sodium canned vegetables.  Fruits All fresh, canned (in natural juice), or frozen fruits. Meat and Other Protein Products Ground beef (85% or leaner), grass-fed beef, or beef trimmed of fat. Skinless chicken or [place]. Ground chicken or [place]. Pork trimmed of fat. All fish and seafood. Eggs. Dried beans, peas, or lentils. Unsalted nuts and seeds. Unsalted canned beans. Dairy Low-fat dairy products, such as skim or 1% milk, 2% or reduced-fat cheeses, low-fat ricotta or cottage cheese, or plain low-fat yogurt. Low-sodium or reduced-sodium cheeses. Fats and Oils Tub margarines without trans fats. Light or reduced-fat mayonnaise and salad dressings (reduced sodium). Avocado. Safflower, olive, or canola oils. Natural peanut or almond butter. Other Unsalted popcorn and pretzels. The items listed above may not be a complete list of recommended foods or beverages. Contact your dietitian for more options. WHAT FOODS ARE NOT RECOMMENDED? Grains White bread. White pasta. White rice. Refined cornbread. Bagels and croissants. Crackers that contain trans fat. Vegetables Creamed or fried vegetables. Vegetables in a cheese sauce. Regular canned vegetables. Regular canned tomato sauce and paste. Regular tomato and vegetable juices. Fruits Dried fruits. Canned fruit in light or heavy syrup. Fruit juice. Meat and Other Protein Products Fatty cuts of meat. Ribs, chicken wings, bacon, sausage, bologna, salami, chitterlings, fatback, hot dogs, bratwurst, and packaged luncheon meats. Salted nuts and seeds. Canned beans with salt. Dairy Whole or 2% milk, cream, half-and-half, and cream cheese. Whole-fat or sweetened yogurt. Full-fat cheeses or blue cheese. Nondairy creamers and whipped toppings. Processed cheese, cheese spreads, or cheese curds. Condiments Onion and garlic salt, seasoned salt, table salt, and sea salt. Canned and packaged gravies. Worcestershire sauce. Tartar sauce. Barbecue sauce. Teriyaki  sauce. Soy sauce, including reduced sodium. Steak sauce. Fish sauce. Oyster sauce. Cocktail sauce. Horseradish. Ketchup and mustard. Meat flavorings and tenderizers. Bouillon cubes. Hot sauce. Tabasco sauce. Marinades. Taco seasonings. Relishes. Fats and Oils Butter, stick margarine, lard, shortening, ghee, and bacon fat. Coconut, palm kernel, or palm oils. Regular salad dressings. Other Pickles and olives. Salted popcorn and pretzels. The items listed above may not be a complete list of foods and beverages to avoid. Contact your dietitian for more information. WHERE CAN I FIND MORE INFORMATION? National Heart, Lung, and [employer] Institute: travelstabloid.com Document Released: 04/26/2011 Document Revised: 09/21/2013 Document Reviewed: 03/11/2013 [facility] Patient Information 2015 [place], [place]. This information is not intended to replace advice given to you by your health care provider. Make sure you discuss any questions you have with your health care provider.

## 2019-12-03 NOTE — Progress Notes (Signed)
FOLLOW UP  Assessment and Plan:   Hypertension Continue current medications Monitor blood pressure at home; patient to call if consistently greater than 130/80 Continue DASH diet.   Reminder to go to the ER if any CP, SOB, nausea, dizziness, severe HA, changes vision/speech, left arm numbness and tingling and jaw pain.  Hyperlipidemia associated with T2DM ([place]) Currently at goal with 20 mg rosuvastatin  LDL goal <70 Continue low cholesterol diet and exercise.  Check lipid panel.   Diabetes with diabetic chronic kidney disease ([place]) Continue medication: metformin - 2000 mg daily  Continue diet and exercise.  Perform daily foot/skin check, notify office of any concerning changes.  Check A1C  CKD 2 associated with T2DM ([facility]) Increase fluids, avoid NSAIDS, monitor sugars, will monitor  Obesity with co morbidities Long discussion about weight loss, diet, and exercise Recommended diet heavy in fruits and veggies and low in animal meats, cheeses, and dairy products, appropriate calorie intake Discussed ideal weight for height  Patient plans to start with weight/resistance training  Will follow up in 3 months  Vitamin D Def Below goal at last visit; taking multivitamin only - advised restart 5000 IU, previously was taking 10000 IU continue supplementation to maintain goal of 60-100 Defer Vit D level to next OV  Continue diet and meds as discussed. Further disposition pending results of labs. Discussed med's effects and SE's.   Over 30 minutes of exam, counseling, chart review, and critical decision making was performed.   Future Appointments  Date Time Provider [place]  06/06/2020 10:00 AM Vicie Mutters, PA-C [id number] None    ----------------------------------------------------------------------------------------------------------------------  HPI 59 y.o. male  presents for 3 month follow up on hypertension, cholesterol, diabetes with CKD, weight and vitamin D  deficiency.   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  He has been trying to work on [employer], has set a personal goal of 180 lb.  Has been following keto like diet, lots of chicken, salad, cucumbers Wt Readings from Last 3 Encounters:  12/04/19 257 lb (116.6 kg)  10/02/19 258 lb 12.8 oz (117.4 kg)  09/04/19 257 lb (116.6 kg)   His blood pressure has not been controlled at home (110-130s/70-80s), today their BP is BP: 122/82  Taking atenolol 100 mg, telmisartan 80 mg, HCTZ 25 mg.   He does not workout. He denies chest pain, shortness of breath, dizziness.    He is on cholesterol medication Rosuvastatin (20 mg daily) and denies myalgias. His cholesterol is at goal. The cholesterol last visit was:   Lab Results  Component Value Date   CHOL 111 09/04/2019   HDL 46 09/04/2019   LDLCALC 52 09/04/2019   TRIG 45 09/04/2019   CHOLHDL 2.4 09/04/2019    He has been working on diet and exercise for T2 diabetes, previously well controlled on metformin 500 mg 4 tabs, and denies increased appetite, nausea, paresthesia of the feet, polydipsia, polyuria and visual disturbances.  Fasting glucose has ranged 130s-150s, tends to run higher in the morning for him, but later in the day runs lower, 80-110s.  Last A1C in the office was:  Lab Results  Component Value Date   HGBA1C 7.1 (H) 09/04/2019    He has CKD II associated with T2DM monitored at this office:  Lab Results  Component Value Date   [facility] 82 09/04/2019   Patient is  on Vitamin D supplement, currently taking 800 IU only in his multivitamin as didn't want to overdue, stopped taking previous 10000 IU  Lab Results  Component Value Date   VD25OH 37 09/04/2019        Current Medications:  Current Outpatient Medications on File Prior to Visit  Medication Sig  . albuterol (VENTOLIN HFA) 108 (90 Base) MCG/ACT  inhaler Inhale 2 puffs into the lungs every 4 (four) hours as needed for wheezing or shortness of breath.  Marland Kitchen aspirin EC 81 MG tablet Take 81 mg by mouth daily.  Marland Kitchen atenolol (TENORMIN) 100 MG tablet Take 1 tablet daily for BP  . Blood Glucose Monitoring Suppl DEVI Check blood sugar fasting in am, in the evening and when feeling bad as needed.  Marland Kitchen glucose blood test strip Use as instructed  . hydrochlorothiazide (HYDRODIURIL) 25 MG tablet Take 1 tablet daily in the morning for BP and fluid, for goal <130/80.  Marland Kitchen Lancets (ACCU-CHEK SOFT TOUCH) lancets Use as instructed  . metFORMIN (GLUCOPHAGE XR) 500 MG 24 hr tablet Take 2 tablet by mouth twice daily for Diabetes  . Multiple Vitamins-Minerals (MULTIVITAMIN WITH MINERALS) tablet Take 1 tablet by mouth daily.  . rosuvastatin (CRESTOR) 20 MG tablet Take 1 tablet (20 mg total) by mouth at bedtime.  Marland Kitchen telmisartan (MICARDIS) 80 MG tablet Take 1/2-1 tablet daily for BP, goal of <130/80.  Marland Kitchen tiZANidine (ZANAFLEX) 4 MG tablet Take 1 tablet (4 mg total) by mouth every 8 (eight) hours as needed for muscle spasms.  Marland Kitchen OVER THE COUNTER MEDICATION  (Patient not taking: Reported on 12/04/2019)   No current facility-administered medications on file prior to visit.     Allergies:  Allergies  Allergen Reactions  . Bee Venom Hives     Medical History:  Past Medical History:  Diagnosis Date  . Allergic rhinitis, cause unspecified   . Arthritis    neck and knees  . Hyperlipidemia   . Hypertension   . Prediabetes    Family history- Reviewed and unchanged Social history- Reviewed and unchanged   Review of Systems:  Review of Systems  Constitutional: Negative for malaise/fatigue and weight loss.  HENT: Negative for hearing loss and tinnitus.   Eyes: Negative for blurred vision and double vision.  Respiratory: Negative for cough, shortness of breath and wheezing.   Cardiovascular: Negative for chest pain, palpitations, orthopnea, claudication and leg  swelling.  Gastrointestinal: Negative for abdominal pain, blood in stool, constipation, diarrhea, heartburn, melena, nausea and vomiting.  Genitourinary: Negative.   Musculoskeletal: Negative for joint pain and myalgias.  Skin: Negative for rash.  Neurological: Negative for dizziness, tingling, sensory change, weakness and headaches.  Endo/Heme/Allergies: Negative for polydipsia.  Psychiatric/Behavioral: Negative.   All other systems reviewed and are negative.     Physical Exam: BP 122/82   Pulse 64   Temp 97.7 F (36.5 C)   Wt 257 lb (116.6 kg)   SpO2 99%   BMI 31.70 kg/m  Wt Readings from Last 3 Encounters:  12/04/19 257 lb (116.6 kg)  10/02/19 258 lb 12.8 oz (117.4 kg)  09/04/19 257 lb (116.6 kg)   General Appearance: Well nourished, in no apparent distress. Eyes: PERRLA, EOMs, conjunctiva no swelling or erythema Sinuses: No Frontal/maxillary tenderness ENT/Mouth: Ext aud canals clear, TMs without erythema, bulging. No erythema, swelling, or exudate on post pharynx.  Tonsils not swollen or erythematous. Hearing normal.  Neck: Supple, thyroid normal.  Respiratory: Respiratory effort normal, BS equal bilaterally without rales, rhonchi, wheezing  or stridor.  Cardio: RRR with no MRGs. Brisk peripheral pulses without edema.  Abdomen: Soft, + BS.  Non tender, no guarding, rebound, hernias, masses. Lymphatics: Non tender without lymphadenopathy.  Musculoskeletal: Full ROM, 5/5 strength, Normal gait Skin: Warm, dry without rashes, lesions, ecchymosis.  Neuro: Cranial nerves intact. No cerebellar symptoms.  Psych: Awake and oriented X 3, normal affect, Insight and Judgment appropriate.    Izora Ribas, NP 8:50 AM [facility]

## 2019-12-04 ENCOUNTER — Other Ambulatory Visit: Payer: Self-pay

## 2019-12-04 ENCOUNTER — Encounter: Payer: Self-pay | Admitting: Adult Health

## 2019-12-04 ENCOUNTER — Ambulatory Visit (INDEPENDENT_AMBULATORY_CARE_PROVIDER_SITE_OTHER): Payer: Managed Care, Other (non HMO) | Admitting: Adult Health

## 2019-12-04 VITALS — BP 122/82 | HR 64 | Temp 97.7°F | Wt 257.0 lb

## 2019-12-04 DIAGNOSIS — E785 Hyperlipidemia, unspecified: Secondary | ICD-10-CM | POA: Diagnosis not present

## 2019-12-04 DIAGNOSIS — E669 Obesity, unspecified: Secondary | ICD-10-CM

## 2019-12-04 DIAGNOSIS — I10 Essential (primary) hypertension: Secondary | ICD-10-CM

## 2019-12-04 DIAGNOSIS — N182 Chronic kidney disease, stage 2 (mild): Secondary | ICD-10-CM

## 2019-12-04 DIAGNOSIS — E1165 Type 2 diabetes mellitus with hyperglycemia: Secondary | ICD-10-CM | POA: Diagnosis not present

## 2019-12-04 DIAGNOSIS — E1122 Type 2 diabetes mellitus with diabetic chronic kidney disease: Secondary | ICD-10-CM | POA: Diagnosis not present

## 2019-12-04 DIAGNOSIS — E1169 Type 2 diabetes mellitus with other specified complication: Secondary | ICD-10-CM

## 2019-12-04 DIAGNOSIS — Z79899 Other long term (current) drug therapy: Secondary | ICD-10-CM | POA: Diagnosis not present

## 2019-12-04 MED ORDER — TELMISARTAN 80 MG PO TABS: ORAL_TABLET | ORAL | 1 refills | Status: DC

## 2019-12-04 NOTE — Patient Instructions (Addendum)
Goals    . Blood Pressure < 130/80    . Exercise 150 min/wk Moderate Activity     15-20 min of exercise to get heart rate up daily, goal 150 min intentional activity per week     . Weight (lb) < 250 lb (113.4 kg)      High-Fiber Diet Fiber, also called dietary fiber, is a type of carbohydrate that is found in fruits, vegetables, whole grains, and beans. A high-fiber diet can have many health benefits. Your health care provider may recommend a high-fiber diet to help:  Prevent constipation. Fiber can make your bowel movements more regular.  Lower your cholesterol.  Relieve the following conditions: ? Swelling of veins in the anus (hemorrhoids). ? Swelling and irritation (inflammation) of specific areas of the digestive tract (uncomplicated diverticulosis). ? A problem of the large intestine (colon) that sometimes causes pain and diarrhea (irritable bowel syndrome, IBS).  Prevent overeating as part of a weight-loss plan.  Prevent heart disease, type 2 diabetes, and certain cancers. What is my plan? The recommended daily fiber intake in grams (g) includes:  38 g for men age 50 or younger.  30 g for men over age 36.  34 g for women age 54 or younger.  21 g for women over age 60. You can get the recommended daily intake of dietary fiber by:  Eating a variety of fruits, vegetables, grains, and beans.  Taking a fiber supplement, if it is not possible to get enough fiber through your diet. What do I need to know about a high-fiber diet?  It is better to get fiber through food sources rather than from fiber supplements. There is not a lot of research about how effective supplements are.  Always check the fiber content on the nutrition facts label of any prepackaged food. Look for foods that contain 5 g of fiber or more per serving.  Talk with a diet and nutrition specialist (dietitian) if you have questions about specific foods that are recommended or not recommended for your  medical condition, especially if those foods are not listed below.  Gradually increase how much fiber you consume. If you increase your intake of dietary fiber too quickly, you may have bloating, cramping, or gas.  Drink plenty of water. Water helps you to digest fiber. What are tips for following this plan?  Eat a wide variety of high-fiber foods.  Make sure that half of the grains that you eat each day are whole grains.  Eat breads and cereals that are made with whole-grain flour instead of refined flour or white flour.  Eat brown rice, bulgur wheat, or millet instead of white rice.  Start the day with a breakfast that is high in fiber, such as a cereal that contains 5 g of fiber or more per serving.  Use beans in place of meat in soups, salads, and pasta dishes.  Eat high-fiber snacks, such as berries, raw vegetables, nuts, and popcorn.  Choose whole fruits and vegetables instead of processed forms like juice or sauce. What foods can I eat?  Fruits Berries. Pears. Apples. Oranges. Avocado. Prunes and raisins. Dried figs. Vegetables Sweet potatoes. Spinach. Kale. Artichokes. Cabbage. Broccoli. Cauliflower. Green peas. Carrots. Squash. Grains Whole-grain breads. Multigrain cereal. Oats and oatmeal. Brown rice. Barley. Bulgur wheat. [place]. Quinoa. Bran muffins. Popcorn. Rye wafer crackers. Meats and other proteins Navy, kidney, and pinto beans. Soybeans. Split peas. Lentils. Nuts and seeds. Dairy Fiber-fortified yogurt. Beverages Fiber-fortified soy milk. Fiber-fortified orange juice.  Other foods Fiber bars. The items listed above may not be a complete list of recommended foods and beverages. Contact a dietitian for more options. What foods are not recommended? Fruits Fruit juice. Cooked, strained fruit. Vegetables Fried potatoes. Canned vegetables. Well-cooked vegetables. Grains White bread. Pasta made with refined flour. White rice. Meats and other proteins Fatty  cuts of meat. Fried chicken or fried fish. Dairy Milk. Yogurt. Cream cheese. Sour cream. Fats and oils Butters. Beverages Soft drinks. Other foods Cakes and pastries. The items listed above may not be a complete list of foods and beverages to avoid. Contact a dietitian for more information. Summary  Fiber is a type of carbohydrate. It is found in fruits, vegetables, whole grains, and beans.  There are many health benefits of eating a high-fiber diet, such as preventing constipation, lowering blood cholesterol, helping with weight loss, and reducing your risk of heart disease, diabetes, and certain cancers.  Gradually increase your intake of fiber. Increasing too fast can result in cramping, bloating, and gas. Drink plenty of water while you increase your fiber.  The best sources of fiber include whole fruits and vegetables, whole grains, nuts, seeds, and beans. This information is not intended to replace advice given to you by your health care provider. Make sure you discuss any questions you have with your health care provider. Document Revised: 03/11/2017 Document Reviewed: 03/11/2017 Elsevier Patient Education  2020 [employer].      Exercising to Lose Weight Exercise is structured, repetitive physical activity to improve fitness and health. Getting regular exercise is important for everyone. It is especially important if you are overweight. Being overweight increases your risk of heart disease, stroke, diabetes, high blood pressure, and several types of cancer. Reducing your calorie intake and exercising can help you lose weight. Exercise is usually categorized as moderate or vigorous intensity. To lose weight, most people need to do a certain amount of moderate-intensity or vigorous-intensity exercise each week. Moderate-intensity exercise  Moderate-intensity exercise is any activity that gets you moving enough to burn at least three times more energy (calories) than if you  were sitting. Examples of moderate exercise include:  Walking a mile in 15 minutes.  Doing light yard work.  Biking at an easy pace. Most people should get at least 150 minutes (2 hours and 30 minutes) a week of moderate-intensity exercise to maintain their body weight. Vigorous-intensity exercise Vigorous-intensity exercise is any activity that gets you moving enough to burn at least six times more calories than if you were sitting. When you exercise at this intensity, you should be working hard enough that you are not able to carry on a conversation. Examples of vigorous exercise include:  Running.  Playing a team sport, such as football, basketball, and soccer.  Jumping rope. Most people should get at least 75 minutes (1 hour and 15 minutes) a week of vigorous-intensity exercise to maintain their body weight. How can exercise affect me? When you exercise enough to burn more calories than you eat, you lose weight. Exercise also reduces body fat and builds muscle. The more muscle you have, the more calories you burn. Exercise also:  Improves mood.  Reduces stress and tension.  Improves your overall fitness, flexibility, and endurance.  Increases bone strength. The amount of exercise you need to lose weight depends on:  Your age.  The type of exercise.  Any health conditions you have.  Your overall physical ability. Talk to your health care provider about how much exercise you  need and what types of activities are safe for you. What actions can I take to lose weight? Nutrition   Make changes to your diet as told by your health care provider or diet and nutrition specialist (dietitian). This may include: ? Eating fewer calories. ? Eating more protein. ? Eating less unhealthy fats. ? Eating a diet that includes fresh fruits and vegetables, whole grains, low-fat dairy products, and lean protein. ? Avoiding foods with added fat, salt, and sugar.  Drink plenty of water  while you exercise to prevent dehydration or heat stroke. Activity  Choose an activity that you enjoy and set realistic goals. Your health care provider can help you make an exercise plan that works for you.  Exercise at a moderate or vigorous intensity most days of the week. ? The intensity of exercise may vary from person to person. You can tell how intense a workout is for you by paying attention to your breathing and heartbeat. Most people will notice their breathing and heartbeat get faster with more intense exercise.  Do resistance training twice each week, such as: ? Push-ups. ? Sit-ups. ? Lifting weights. ? Using resistance bands.  Getting short amounts of exercise can be just as helpful as long structured periods of exercise. If you have trouble finding time to exercise, try to include exercise in your daily routine. ? Get up, stretch, and walk around every 30 minutes throughout the day. ? Go for a walk during your lunch break. ? Park your car farther away from your destination. ? If you take public transportation, get off one stop early and walk the rest of the way. ? Make phone calls while standing up and walking around. ? Take the stairs instead of elevators or escalators.  Wear comfortable clothes and shoes with good support.  Do not exercise so much that you hurt yourself, feel dizzy, or get very short of breath. Where to find more information  [place].S[place] Department of Health and Human Services: [employer]at  Centers for Disease Control and Prevention (CDC): http://www.wolf.info/ Contact a health care provider:  Before starting a new exercise program.  If you have questions or concerns about your weight.  If you have a medical problem that keeps you from exercising. Get help right away if you have any of the following while exercising:  Injury.  Dizziness.  Difficulty breathing or shortness of breath that does not go away when you stop exercising.  Chest pain.  Rapid  heartbeat. Summary  Being overweight increases your risk of heart disease, stroke, diabetes, high blood pressure, and several types of cancer.  Losing weight happens when you burn more calories than you eat.  Reducing the amount of calories you eat in addition to getting regular moderate or vigorous exercise each week helps you lose weight. This information is not intended to replace advice given to you by your health care provider. Make sure you discuss any questions you have with your health care provider. Document Revised: 05/20/2017 Document Reviewed: 05/20/2017 Elsevier Patient Education  2020 [employer].

## 2019-12-05 LAB — CBC WITH DIFFERENTIAL/PLATELET
Absolute Monocytes: 754 cells/uL (ref 200–950)
Basophils Absolute: 49 cells/uL (ref 0–200)
Basophils Relative: 0.6 %
Eosinophils Absolute: 279 cells/uL (ref 15–500)
Eosinophils Relative: 3.4 %
HCT: 41.3 % (ref 38.5–50.0)
Hemoglobin: 13.9 g/dL (ref 13.2–17.1)
Lymphs Abs: 1722 cells/uL (ref 850–3900)
MCH: 31.6 pg (ref 27.0–33.0)
MCHC: 33.7 g/dL (ref 32.0–36.0)
MCV: 93.9 fL (ref 80.0–100.0)
MPV: 11.5 fL (ref 7.5–12.5)
Monocytes Relative: 9.2 %
Neutro Abs: 5396 cells/uL (ref 1500–7800)
Neutrophils Relative %: 65.8 %
Platelets: 176 10*3/uL (ref 140–400)
RBC: 4.4 10*6/uL (ref 4.20–5.80)
RDW: 12.9 % (ref 11.0–15.0)
Total Lymphocyte: 21 %
WBC: 8.2 10*3/uL (ref 3.8–10.8)

## 2019-12-05 LAB — COMPLETE METABOLIC PANEL WITH GFR
AG Ratio: 1.9 (calc) (ref 1.0–2.5)
ALT: 34 U/L (ref 9–46)
AST: 29 U/L (ref 10–35)
Albumin: 4.4 g/dL (ref 3.6–5.1)
Alkaline phosphatase (APISO): 64 U/L (ref 35–144)
BUN/Creatinine Ratio: 37 (calc) — ABNORMAL HIGH (ref 6–22)
BUN: 46 mg/dL — ABNORMAL HIGH (ref 7–25)
CO2: 23 mmol/L (ref 20–32)
Calcium: 9.3 mg/dL (ref 8.6–10.3)
Chloride: 108 mmol/L (ref 98–110)
Creat: 1.26 mg/dL (ref 0.70–1.33)
GFR, Est African American: 73 mL/min/{1.73_m2} (ref >=60)
GFR, Est Non African American: 63 mL/min/{1.73_m2} (ref >=60)
Globulin: 2.3 g/dL (calc) (ref 1.9–3.7)
Glucose, Bld: 138 mg/dL — ABNORMAL HIGH (ref 65–99)
Potassium: 4.3 mmol/L (ref 3.5–5.3)
Sodium: 139 mmol/L (ref 135–146)
Total Bilirubin: 0.6 mg/dL (ref 0.2–1.2)
Total Protein: 6.7 g/dL (ref 6.1–8.1)

## 2019-12-05 LAB — MAGNESIUM: Magnesium: 1.8 mg/dL (ref 1.5–2.5)

## 2019-12-05 LAB — LIPID PANEL
Cholesterol: 104 mg/dL (ref <200)
HDL: 52 mg/dL (ref >=40)
LDL Cholesterol (Calc): 39 mg/dL (calc)
Non-HDL Cholesterol (Calc): 52 mg/dL (calc) (ref <130)
Total CHOL/HDL Ratio: 2 (calc) (ref <5.0)
Triglycerides: 57 mg/dL (ref <150)

## 2019-12-05 LAB — TSH: TSH: 1.52 mIU/L (ref 0.40–4.50)

## 2019-12-05 LAB — HEMOGLOBIN A1C
Hgb A1c MFr Bld: 7.3 % of total Hgb — ABNORMAL HIGH (ref <5.7)
Mean Plasma Glucose: 163 (calc)
eAG (mmol/L): 9 (calc)

## 2019-12-30 ENCOUNTER — Other Ambulatory Visit: Payer: Self-pay | Admitting: Internal Medicine

## 2019-12-30 DIAGNOSIS — I10 Essential (primary) hypertension: Secondary | ICD-10-CM

## 2019-12-30 MED ORDER — ATENOLOL 100 MG PO TABS: ORAL_TABLET | ORAL | 0 refills | Status: DC

## 2020-01-26 ENCOUNTER — Other Ambulatory Visit: Payer: Self-pay | Admitting: Internal Medicine

## 2020-01-26 DIAGNOSIS — I10 Essential (primary) hypertension: Secondary | ICD-10-CM

## 2020-01-26 MED ORDER — TELMISARTAN 80 MG PO TABS: ORAL_TABLET | ORAL | 0 refills | Status: DC

## 2020-02-15 ENCOUNTER — Ambulatory Visit (INDEPENDENT_AMBULATORY_CARE_PROVIDER_SITE_OTHER): Payer: Managed Care, Other (non HMO) | Admitting: Adult Health

## 2020-02-15 ENCOUNTER — Encounter: Payer: Self-pay | Admitting: Adult Health

## 2020-02-15 ENCOUNTER — Other Ambulatory Visit: Payer: Self-pay

## 2020-02-15 VITALS — BP 110/72 | HR 56 | Temp 97.5°F | Wt 260.0 lb

## 2020-02-15 DIAGNOSIS — M62830 Muscle spasm of back: Secondary | ICD-10-CM

## 2020-02-15 DIAGNOSIS — S29012A Strain of muscle and tendon of back wall of thorax, initial encounter: Secondary | ICD-10-CM | POA: Diagnosis not present

## 2020-02-15 MED ORDER — TIZANIDINE HCL 4 MG PO TABS: 4.0000 mg | ORAL_TABLET | Freq: Three times a day (TID) | ORAL | 3 refills | Status: AC | PRN

## 2020-02-15 MED ORDER — MELOXICAM 15 MG PO TABS: ORAL_TABLET | ORAL | 1 refills | Status: AC

## 2020-02-15 NOTE — Progress Notes (Signed)
Assessment and Plan:  Rashed was seen today for back pain.  Diagnoses and all orders for this visit:  Muscle strain of upper back Spasm of thoracic back muscle Prednisone was not prescribed,NSAIDs, RICE, and exercise given Proper lifting, bending technique discussed. Stretching exercises discussed. Short (2-4 day) period of relative rest recommended until acute symptoms improve. Heat to affected area as needed for local pain relief. NSAIDs per medication orders. Muscle relaxants per medication orders. Suggested massage therapy -  If persistent/not resolving may benefit from PT via established ortho, follow up if not improving -     meloxicam (MOBIC) 15 MG tablet; Take one daily with food for 2 weeks, can take with tylenol, can not take with aleve, iburpofen, then as needed daily for pain -     tiZANidine (ZANAFLEX) 4 MG tablet; Take 1 tablet (4 mg total) by mouth every 8 (eight) hours as needed for muscle spasms.  Further disposition pending results of labs. Discussed med's effects and SE's.   Over 15 minutes of exam, counseling, chart review, and critical decision making was performed.   Future Appointments  Date Time Provider [place]  03/09/2020  9:30 AM Liane Comber, NP [facility] None  06/06/2020 10:00 AM Liane Comber, NP [facility] None    ------------------------------------------------------------------------------------------------------------------   HPI BP 110/72   Pulse (!) 56   Temp (!) 97.5 F (36.4 C)   Wt 260 lb (117.9 kg)   SpO2 98%   BMI 32.07 kg/m   59 y.o.male R handed, presents for evaluation of upper back pain. Denies notable hx of back problems. He does work physical job doing Cabin crew, known cervical arthritis, follows with [facility], monitoring only.   He reports sx began about 2 months ago, started noting increased soreness, "hurts" in muscles around shoulder blade; started on left, intermittent, then mostly resolved,  but now having on R side, more severe, left work today due to this. Reports pain ranged 3-5/10, did wake up with this in AM, but typically worse at work when lifting and at end of the day. He has also noted spasming in muscles.   Has been applying heat with some benefit. 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 Denies radiating pain, midline, upper extremity weakness, numbness/tingling.   Past Medical History:  Diagnosis Date  . Allergic rhinitis, cause unspecified   . Arthritis    neck and knees  . Hyperlipidemia   . Hypertension   . Prediabetes      Allergies  Allergen Reactions  . Bee Venom Hives    Current Outpatient Medications on File Prior to Visit  Medication Sig  . albuterol (VENTOLIN HFA) 108 (90 Base) MCG/ACT inhaler Inhale 2 puffs into the lungs every 4 (four) hours as needed for wheezing or shortness of breath.  Marland Kitchen aspirin EC 81 MG tablet Take 81 mg by mouth daily.  Marland Kitchen atenolol (TENORMIN) 100 MG tablet Take 1 tablet daily for BP  . Blood Glucose Monitoring Suppl DEVI Check blood sugar fasting in am, in the evening and when feeling bad as needed.  Marland Kitchen glucose blood test strip Use as instructed  . hydrochlorothiazide (HYDRODIURIL) 25 MG tablet Take 1 tablet daily in the morning for BP and fluid, for goal <130/80.  Marland Kitchen Lancets (ACCU-CHEK SOFT TOUCH) lancets Use as instructed  . metFORMIN (GLUCOPHAGE XR) 500 MG 24 hr tablet Take 2 tablet by mouth twice daily for Diabetes  . Multiple Vitamins-Minerals (MULTIVITAMIN WITH MINERALS) tablet Take 1 tablet by mouth  daily.  . rosuvastatin (CRESTOR) 20 MG tablet Take 1 tablet (20 mg total) by mouth at bedtime.  Marland Kitchen telmisartan (MICARDIS) 80 MG tablet Take 1 tablet     Daily     for BP  . tiZANidine (ZANAFLEX) 4 MG tablet Take 1 tablet (4 mg total) by mouth every 8 (eight) hours as needed for muscle spasms. (Patient not taking: Reported on 02/15/2020)   No current facility-administered medications on file prior to  visit.    ROS: Review of Systems  Constitutional: Negative for chills, diaphoresis, fever, malaise/fatigue and weight loss.  HENT: Negative.   Respiratory: Negative for cough, sputum production and shortness of breath.   Cardiovascular: Negative.  Negative for chest pain, palpitations and PND.  Gastrointestinal: Negative for abdominal pain, heartburn, nausea and vomiting.  Musculoskeletal: Positive for back pain and neck pain. Negative for falls and joint pain.  Skin: Negative for rash.  Neurological: Negative for dizziness, tingling, sensory change, weakness and headaches.  Endo/Heme/Allergies: Negative for polydipsia.     Physical Exam:  BP 110/72   Pulse (!) 56   Temp (!) 97.5 F (36.4 C)   Wt 260 lb (117.9 kg)   SpO2 98%   BMI 32.07 kg/m   General Appearance: Well nourished, in no apparent distress. Eyes: PERRL, conjunctiva no swelling or erythema ENT/Mouth: Mask in place; Hearing normal.  Neck: Supple Respiratory: Respiratory effort normal, BS equal bilaterally without rales, rhonchi, wheezing or stridor.  Cardio: RRR with no MRGs. Brisk peripheral pulses without edema.  Abdomen: Soft, + BS.  Non tender Lymphatics: Non tender without lymphadenopathy.  Musculoskeletal: Full ROM bil shoulders, 5/5 strength upper and lower extremities, normal gait. No midline spine tenderness, has tight, tender spasming muscles bil traps and paraspinal Skin: Warm, dry without rashes, lesions, ecchymosis.  Neuro: Cranial nerves intact. Normal muscle tone, no cerebellar symptoms. Sensation intact.  Psych: Awake and oriented X 3, normal affect, Insight and Judgment appropriate.     Izora Ribas, NP 11:46 AM [facility]

## 2020-02-15 NOTE — Patient Instructions (Addendum)
Recommend massage, heat application      Muscle Cramps and Spasms Muscle cramps and spasms are when muscles tighten by themselves. They usually get better within minutes. Muscle cramps are painful. They are usually stronger and last longer than muscle spasms. Muscle spasms may or may not be painful. They can last a few seconds or much longer. Cramps and spasms can affect any muscle, but they occur most often in the calf muscles of the leg. They are usually not caused by a serious problem. In many cases, the cause is not known. Some common causes include:  Doing more physical work or exercise than your body is ready for.  Using the muscles too much (overuse) by repeating certain movements too many times.  Staying in a certain position for a long time.  Playing a sport or doing an activity without preparing properly.  Using bad form or technique while playing a sport or doing an activity.  Not having enough water in your body (dehydration).  Injury.  Side effects of some medicines.  Low levels of the salts and minerals in your blood (electrolytes), such as low potassium or calcium. Follow these instructions at home: Managing pain and stiffness      Massage, stretch, and relax the muscle. Do this for many minutes at a time.  If told, put heat on tight or tense muscles as often as told by your doctor. Use the heat source that your doctor recommends, such as a moist heat pack or a heating pad. ? Place a towel between your skin and the heat source. ? Leave the heat on for 20-30 minutes. ? Remove the heat if your skin turns bright red. This is very important if you are not able to feel pain, heat, or cold. You may have a greater risk of getting burned.  If told, put ice on the affected area. This may help if you are sore or have pain after a cramp or spasm. ? Put ice in a plastic bag. ? Place a towel between your skin and the bag. ? Leave the ice on for 20 minutes, 2-3 times a  day.  Try taking hot showers or baths to help relax tight muscles. Eating and drinking  Drink enough fluid to keep your pee (urine) pale yellow.  Eat a healthy diet to help ensure that your muscles work well. This should include: ? Fruits and vegetables. ? Lean protein. ? Whole grains. ? Low-fat or nonfat dairy products. General instructions  If you are having cramps often, avoid intense exercise for several days.  Take over-the-counter and prescription medicines only as told by your doctor.  Watch for any changes in your symptoms.  Keep all follow-up visits as told by your doctor. This is important. Contact a doctor if:  Your cramps or spasms get worse or happen more often.  Your cramps or spasms do not get better with time. Summary  Muscle cramps and spasms are when muscles tighten by themselves. They usually get better within minutes.  Cramps and spasms occur most often in the calf muscles of the leg.  Massage, stretch, and relax the muscle. This may help the cramp or spasm go away.  Drink enough fluid to keep your pee (urine) pale yellow. This information is not intended to replace advice given to you by your health care provider. Make sure you discuss any questions you have with your health care provider. Document Revised: 09/30/2017 Document Reviewed: 09/30/2017 Elsevier Patient Education  [employer].  Tizanidine tablets or capsules What is this medicine? TIZANIDINE (tye ZAN i deen) helps to relieve muscle spasms. It may be used to help in the treatment of multiple sclerosis and spinal cord injury. This medicine may be used for other purposes; ask your health care provider or pharmacist if you have questions. COMMON BRAND NAME(S): Zanaflex What should I tell my health care provider before I take this medicine? They need to know if you have any of these conditions:  kidney disease  liver disease  low blood pressure  mental disorder  an unusual  or allergic reaction to tizanidine, other medicines, lactose (tablets only), foods, dyes, or preservatives  pregnant or trying to get pregnant  breast-feeding How should I use this medicine? Take this medicine by mouth with a full glass of water. Take this medicine on an empty stomach, at least 30 minutes before or 2 hours after food. Do not take with food unless you talk with your doctor. Follow the directions on the prescription label. Take your medicine at regular intervals. Do not take your medicine more often than directed. Do not stop taking except on your doctor's advice. Suddenly stopping the medicine can be very dangerous. Talk to your pediatrician regarding the use of this medicine in children. Patients over 20 years old may have a stronger reaction and need a smaller dose. Overdosage: If you think you have taken too much of this medicine contact a poison control center or emergency room at once. NOTE: This medicine is only for you. Do not share this medicine with others. What if I miss a dose? If you miss a dose, take it as soon as you can. If it is almost time for your next dose, take only that dose. Do not take double or extra doses. What may interact with this medicine? Do not take this medicine with any of the following medications:  ciprofloxacin  fluvoxamine  narcotic medicines for cough  thiabendazole This medicine may also interact with the following medications:  acyclovir  alcohol  antihistamines for allergy, cough, and cold  baclofen  certain medicines for anxiety or sleep  certain medicines for blood pressure, heart disease, irregular heartbeat  certain medicines for depression like amitriptyline, fluoxetine, sertraline  certain medicines for seizures like phenobarbital, primidone  certain medicines for stomach problems like cimetidine, famotidine  male hormones, like estrogens or progestins and birth control pills, patches, rings, or  injections  general anesthetics like halothane, isoflurane, methoxyflurane, propofol  local anesthetics like lidocaine, pramoxine, tetracaine  medicines that relax muscles for surgery  narcotic medicines for pain  phenothiazines like chlorpromazine, mesoridazine, prochlorperazine  ticlopidine  zileuton This list may not describe all possible interactions. Give your health care provider a list of all the medicines, herbs, non-prescription drugs, or dietary supplements you use. Also tell them if you smoke, drink alcohol, or use illegal drugs. Some items may interact with your medicine. What should I watch for while using this medicine? Tell your doctor or health care professional if your symptoms do not start to get better or if they get worse. You may get drowsy or dizzy. Do not drive, use machinery, or do anything that needs mental alertness until you know how this medicine affects you. Do not stand or sit up quickly, especially if you are an older patient. This reduces the risk of dizzy or fainting spells. Alcohol may interfere with the effect of this medicine. Avoid alcoholic drinks. If you are taking another medicine that also causes drowsiness, you  may have more side effects. Give your health care provider a list of all medicines you use. Your doctor will tell you how much medicine to take. Do not take more medicine than directed. Call emergency for help if you have problems breathing or unusual sleepiness. Your mouth may get dry. Chewing sugarless gum or sucking hard candy, and drinking plenty of water may help. Contact your doctor if the problem does not go away or is severe. What side effects may I notice from receiving this medicine? Side effects that you should report to your doctor or health care professional as soon as possible:  allergic reactions like skin rash, itching or hives, swelling of the face, lips, or tongue  breathing problems  hallucinations  signs and symptoms  of liver injury like dark yellow or brown urine; general ill feeling or flu-like symptoms; light-colored stools; loss of appetite; nausea; right upper quadrant belly pain; unusually weak or tired; yellowing of the eyes or skin  signs and symptoms of low blood pressure like dizziness; feeling faint or lightheaded, falls; unusually weak or tired  unusually slow heartbeat  unusually weak or tired Side effects that usually do not require medical attention (report to your doctor or health care professional if they continue or are bothersome):  blurred vision  constipation  dizziness  dry mouth  tiredness This list may not describe all possible side effects. Call your doctor for medical advice about side effects. You may report side effects to FDA at 1-800-FDA-1088. Where should I keep my medicine? Keep out of the reach of children. Store at room temperature between 15 and 30 degrees C (59 and 86 degrees F). Throw away any unused medicine after the expiration date. NOTE: This sheet is a summary. It may not cover all possible information. If you have questions about this medicine, talk to your doctor, pharmacist, or health care provider.  2020 Elsevier/Gold Standard (2017-02-19 13:33:29)

## 2020-02-22 ENCOUNTER — Other Ambulatory Visit: Payer: Self-pay | Admitting: *Deleted

## 2020-02-22 DIAGNOSIS — I10 Essential (primary) hypertension: Secondary | ICD-10-CM

## 2020-02-22 MED ORDER — TELMISARTAN 80 MG PO TABS: ORAL_TABLET | ORAL | 0 refills | Status: DC

## 2020-03-07 NOTE — Progress Notes (Deleted)
FOLLOW UP  Assessment and Plan:   Hypertension Continue current medications Monitor blood pressure at home; patient to call if consistently greater than 130/80 Continue DASH diet.   Reminder to go to the ER if any CP, SOB, nausea, dizziness, severe HA, changes vision/speech, left arm numbness and tingling and jaw pain.  Hyperlipidemia associated with T2DM ([place]) Currently at goal with 20 mg rosuvastatin, reduce to 10 mg daily if remains aggressively controlled LDL goal <70 Continue low cholesterol diet and exercise.  Check lipid panel.   Diabetes with diabetic chronic kidney disease ([place]) Continue medication: metformin - 2000 mg daily  Continue diet and exercise.  Perform daily foot/skin check, notify office of any concerning changes.  Check A1C  CKD 2 associated with T2DM ([facility]) Increase fluids, avoid NSAIDS, monitor sugars, will monitor  Obesity with co morbidities Long discussion about weight loss, diet, and exercise Recommended diet heavy in fruits and veggies and low in animal meats, cheeses, and dairy products, appropriate calorie intake Discussed ideal weight for height  Patient plans to start with weight/resistance training  Will follow up in 3 months  Vitamin D Def Below goal at last visit; was taking multivitamin only, advised to restart 5000 IU, previously was taking 10000 IU continue supplementation to maintain goal of 60-100 Check Vit D level  Continue diet and meds as discussed. Further disposition pending results of labs. Discussed med's effects and SE's.   Over 30 minutes of exam, counseling, chart review, and critical decision making was performed.   Future Appointments  Date Time Provider [place]  03/09/2020  9:30 AM Liane Comber, NP [facility] None  06/06/2020 10:00 AM Liane Comber, NP [facility] None    ----------------------------------------------------------------------------------------------------------------------  HPI 59 y.o.  male  presents for 3 month follow up on hypertension, cholesterol, diabetes with CKD, weight and vitamin D deficiency.   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  He has been trying to work on [employer], has set a personal goal of 180 lb.  Has been following keto like diet, lots of chicken, salad, cucumbers Wt Readings from Last 3 Encounters:  02/15/20 260 lb (117.9 kg)  12/04/19 257 lb (116.6 kg)  10/02/19 258 lb 12.8 oz (117.4 kg)   His blood pressure has not been controlled at home (110-130s/70-80s), today their BP is    Taking atenolol 100 mg, telmisartan 80 mg, HCTZ 25 mg.   He does not workout. He denies chest pain, shortness of breath, dizziness.    He is on cholesterol medication Rosuvastatin (20 mg daily) and denies myalgias. His cholesterol is at goal. The cholesterol last visit was:   Lab Results  Component Value Date   CHOL 104 12/04/2019   HDL 52 12/04/2019   LDLCALC 39 12/04/2019   TRIG 57 12/04/2019   CHOLHDL 2.0 12/04/2019    He has been working on diet and exercise for T2 diabetes, previously well controlled on metformin 500 mg 4 tabs, and denies increased appetite, nausea, paresthesia of the feet, polydipsia, polyuria and visual disturbances.  Fasting glucose has ranged 130s-150s, tends to run higher in the morning for him, but reports later in the day runs lower, 80-110s.  Last A1C in the office was:  Lab Results  Component Value Date   HGBA1C 7.3 (H) 12/04/2019    He has CKD II associated  with T2DM monitored at this office:  Lab Results  Component Value Date   [facility] 63 12/04/2019   Patient is on Vitamin D supplement, currently taking 800 IU only in his multivitamin as didn't want to overdo, stopped taking previous 10000 IU  Lab Results  Component Value Date   VD25OH 37 09/04/2019        Current  Medications:  Current Outpatient Medications on File Prior to Visit  Medication Sig  . albuterol (VENTOLIN HFA) 108 (90 Base) MCG/ACT inhaler Inhale 2 puffs into the lungs every 4 (four) hours as needed for wheezing or shortness of breath.  Marland Kitchen aspirin EC 81 MG tablet Take 81 mg by mouth daily.  Marland Kitchen atenolol (TENORMIN) 100 MG tablet Take 1 tablet daily for BP  . Blood Glucose Monitoring Suppl DEVI Check blood sugar fasting in am, in the evening and when feeling bad as needed.  Marland Kitchen glucose blood test strip Use as instructed  . hydrochlorothiazide (HYDRODIURIL) 25 MG tablet Take 1 tablet daily in the morning for BP and fluid, for goal <130/80.  Marland Kitchen Lancets (ACCU-CHEK SOFT TOUCH) lancets Use as instructed  . meloxicam (MOBIC) 15 MG tablet Take one daily with food for 2 weeks, can take with tylenol, can not take with aleve, iburpofen, then as needed daily for pain  . metFORMIN (GLUCOPHAGE XR) 500 MG 24 hr tablet Take 2 tablet by mouth twice daily for Diabetes  . Multiple Vitamins-Minerals (MULTIVITAMIN WITH MINERALS) tablet Take 1 tablet by mouth daily.  . rosuvastatin (CRESTOR) 20 MG tablet Take 1 tablet (20 mg total) by mouth at bedtime.  Marland Kitchen telmisartan (MICARDIS) 80 MG tablet Take 1 tablet     Daily     for BP  . tiZANidine (ZANAFLEX) 4 MG tablet Take 1 tablet (4 mg total) by mouth every 8 (eight) hours as needed for muscle spasms.   No current facility-administered medications on file prior to visit.     Allergies:  Allergies  Allergen Reactions  . Bee Venom Hives     Medical History:  Past Medical History:  Diagnosis Date  . Allergic rhinitis, cause unspecified   . Arthritis    neck and knees  . Hyperlipidemia   . Hypertension   . Prediabetes    Family history- Reviewed and unchanged Social history- Reviewed and unchanged   Review of Systems:  Review of Systems  Constitutional: Negative for malaise/fatigue and weight loss.  HENT: Negative for hearing loss and tinnitus.   Eyes:  Negative for blurred vision and double vision.  Respiratory: Negative for cough, shortness of breath and wheezing.   Cardiovascular: Negative for chest pain, palpitations, orthopnea, claudication and leg swelling.  Gastrointestinal: Negative for abdominal pain, blood in stool, constipation, diarrhea, heartburn, melena, nausea and vomiting.  Genitourinary: Negative.   Musculoskeletal: Negative for joint pain and myalgias.  Skin: Negative for rash.  Neurological: Negative for dizziness, tingling, sensory change, weakness and headaches.  Endo/Heme/Allergies: Negative for polydipsia.  Psychiatric/Behavioral: Negative.   All other systems reviewed and are negative.     Physical Exam: There were no vitals taken for this visit. Wt Readings from Last 3 Encounters:  02/15/20 260 lb (117.9 kg)  12/04/19 257 lb (116.6 kg)  10/02/19 258 lb 12.8 oz (117.4 kg)   General Appearance: Well nourished, in no apparent distress. Eyes: PERRLA, EOMs, conjunctiva no swelling or erythema Sinuses: No Frontal/maxillary tenderness ENT/Mouth: Ext aud canals clear, TMs without erythema, bulging. No erythema, swelling, or exudate on post pharynx.  Tonsils  not swollen or erythematous. Hearing normal.  Neck: Supple, thyroid normal.  Respiratory: Respiratory effort normal, BS equal bilaterally without rales, rhonchi, wheezing or stridor.  Cardio: RRR with no MRGs. Brisk peripheral pulses without edema.  Abdomen: Soft, + BS.  Non tender, no guarding, rebound, hernias, masses. Lymphatics: Non tender without lymphadenopathy.  Musculoskeletal: Full ROM, 5/5 strength, Normal gait Skin: Warm, dry without rashes, lesions, ecchymosis.  Neuro: Cranial nerves intact. No cerebellar symptoms.  Psych: Awake and oriented X 3, normal affect, Insight and Judgment appropriate.    Izora Ribas, NP 8:15 AM [facility]

## 2020-03-08 ENCOUNTER — Other Ambulatory Visit: Payer: Self-pay

## 2020-03-08 MED ORDER — HYDROCHLOROTHIAZIDE 25 MG PO TABS: ORAL_TABLET | ORAL | 1 refills | Status: AC

## 2020-03-09 ENCOUNTER — Ambulatory Visit: Payer: Managed Care, Other (non HMO) | Admitting: Adult Health

## 2020-03-09 DIAGNOSIS — E1165 Type 2 diabetes mellitus with hyperglycemia: Secondary | ICD-10-CM

## 2020-03-09 DIAGNOSIS — E669 Obesity, unspecified: Secondary | ICD-10-CM

## 2020-03-09 DIAGNOSIS — E559 Vitamin D deficiency, unspecified: Secondary | ICD-10-CM

## 2020-03-09 DIAGNOSIS — E1169 Type 2 diabetes mellitus with other specified complication: Secondary | ICD-10-CM

## 2020-03-09 DIAGNOSIS — I10 Essential (primary) hypertension: Secondary | ICD-10-CM

## 2020-03-09 DIAGNOSIS — N182 Chronic kidney disease, stage 2 (mild): Secondary | ICD-10-CM

## 2020-03-11 ENCOUNTER — Ambulatory Visit: Payer: Managed Care, Other (non HMO) | Admitting: Physician Assistant

## 2020-03-22 ENCOUNTER — Encounter: Payer: 59 | Admitting: Physician Assistant

## 2020-04-01 ENCOUNTER — Encounter: Payer: Self-pay | Admitting: Adult Health

## 2020-04-01 ENCOUNTER — Ambulatory Visit (INDEPENDENT_AMBULATORY_CARE_PROVIDER_SITE_OTHER): Payer: Managed Care, Other (non HMO) | Admitting: Adult Health

## 2020-04-01 ENCOUNTER — Other Ambulatory Visit: Payer: Self-pay

## 2020-04-01 VITALS — BP 130/82 | HR 59 | Temp 97.5°F | Wt 265.0 lb

## 2020-04-01 DIAGNOSIS — H65111 Acute and subacute allergic otitis media (mucoid) (sanguinous) (serous), right ear: Secondary | ICD-10-CM | POA: Diagnosis not present

## 2020-04-01 NOTE — Patient Instructions (Signed)
Your ears and sinuses are connected by the eustachian tube. When your sinuses are inflamed, this can close off the tube and cause fluid to collect in your middle ear. This can then cause dizziness, popping, clicking, ringing, and echoing in your ears. This is often NOT an infection and does NOT require antibiotics, it is caused by inflammation so the treatments help the inflammation. This can take a long time to get better so please be patient.   if worsening HA, changes vision/speech, imbalance, weakness go to the ER    Medicines you can use  Nasal congestion  Little Remedies saline spray (aerosol/mist)- can try this, it is in the kids section - pseudoephedrine (Sudafed)- behind the counter, do not use if you have high blood pressure, medicine that have -D in them.  - phenylephrine (Sudafed PE) -Dextormethorphan + chlorpheniramine (Coridcidin HBP)- okay if you have high blood pressure -Oxymetazoline (Afrin) nasal spray- LIMIT to 3 days -Saline nasal spray -Neti pot (used distilled or bottled water)  Ear pain/congestion  -pseudoephedrine (sudafed) - Nasonex/flonase nasal spray  Fever  -Acetaminophen (Tyelnol) -Ibuprofen (Advil, motrin, aleve)  Allergy symptoms (cough, sneeze, runny nose, itchy eyes) -Claritin or loratadine cheapest but likely the weakest  -Zyrtec or certizine at night because it can make you sleepy -The strongest is allegra or fexafinadine  Cheapest at [facility], [employer], [employer]  Cough  -Dextromethorphan (Delsym)- medicine that has DM in it -Guafenesin (Mucinex/Robitussin) - cough drops - drink lots of water  Red Itchy Eyes  - Naphcon-A  General health when sick  -Hydration -wash your hands frequently -keep surfaces clean -change pillow cases and sheets often -Get fresh air but do not exercise strenuously -Vitamin D, double up on it - Vitamin C -Zinc

## 2020-04-01 NOTE — Progress Notes (Signed)
Assessment and Plan:  Dale Tapia was seen today for acute visit.  Diagnoses and all orders for this visit:  Acute allergic serous otitis media of right ear -Allergy pill, decongestants, autoinflation, explained no need for ABX at this time.  -doesn't tolerate Flonase (bleeding and ulcers); offered short oral steroid should sx become worse despite above therapy - if not better 2-4 weeks will refer to ENT  Further disposition pending results of labs. Discussed med's effects and SE's.   Over 15 minutes of exam, counseling, chart review, and critical decision making was performed.   Future Appointments  Date Time Provider [place]  06/06/2020 10:00 AM Liane Comber, NP [facility] None    ------------------------------------------------------------------------------------------------------------------   HPI BP 130/82    Pulse (!) 59    Temp (!) 97.5 F (36.4 C)    Wt 265 lb (120.2 kg)    SpO2 97%    BMI 32.69 kg/m   59 y.o.male with hx of environmental allergies, T2DM presents for evaluation of popping and crackling in his R ear, intermittently over last 2 weeks.   He reports 1 month of typical allergy sx; congestion, runny nose (clear with intermittent bloody which is typical for him), sneezing, watery/itchy eyes.   He reports in the last 2 weeks has noted intermittent sense of fullness, crackling, popping in R ear. Hearing is normal other than when active crackling. Denies pain, ear discharge, fever/chills, sore throat, sinus pressure/tenderness, dizziness.    Lab Results  Component Value Date   HGBA1C 7.3 (H) 12/04/2019     Past Medical History:  Diagnosis Date   Allergic rhinitis, cause unspecified    Arthritis    neck and knees   Hyperlipidemia    Hypertension    Prediabetes      Allergies  Allergen Reactions   Bee Venom Hives    Current Outpatient Medications on File Prior to Visit  Medication Sig   aspirin EC 81 MG tablet Take 81 mg by mouth  daily.   atenolol (TENORMIN) 100 MG tablet Take 1 tablet daily for BP   Blood Glucose Monitoring Suppl DEVI Check blood sugar fasting in am, in the evening and when feeling bad as needed.   glucose blood test strip Use as instructed   hydrochlorothiazide (HYDRODIURIL) 25 MG tablet Take 1 tablet daily in the morning for BP and fluid, for goal <130/80.   Lancets (ACCU-CHEK SOFT TOUCH) lancets Use as instructed   meloxicam (MOBIC) 15 MG tablet Take one daily with food for 2 weeks, can take with tylenol, can not take with aleve, iburpofen, then as needed daily for pain   metFORMIN (GLUCOPHAGE XR) 500 MG 24 hr tablet Take 2 tablet by mouth twice daily for Diabetes   Multiple Vitamins-Minerals (MULTIVITAMIN WITH MINERALS) tablet Take 1 tablet by mouth daily.   rosuvastatin (CRESTOR) 20 MG tablet Take 1 tablet (20 mg total) by mouth at bedtime.   telmisartan (MICARDIS) 80 MG tablet Take 1 tablet     Daily     for BP   tiZANidine (ZANAFLEX) 4 MG tablet Take 1 tablet (4 mg total) by mouth every 8 (eight) hours as needed for muscle spasms.   albuterol (VENTOLIN HFA) 108 (90 Base) MCG/ACT inhaler Inhale 2 puffs into the lungs every 4 (four) hours as needed for wheezing or shortness of breath. (Patient not taking: Reported on 04/01/2020)   No current facility-administered medications on file prior to visit.    ROS: all negative except above.   Physical Exam:  BP 130/82    Pulse (!) 59    Temp (!) 97.5 F (36.4 C)    Wt 265 lb (120.2 kg)    SpO2 97%    BMI 32.69 kg/m   General Appearance: Well nourished, in no apparent distress. Eyes: PERRLA, conjunctiva no swelling or erythema Sinuses: No Frontal/maxillary tenderness ENT/Mouth: Ext aud canals clear, TMs without erythema, bulging. R with visible air bubbles. No erythema, swelling, or exudate on post pharynx.  Tonsils not swollen or erythematous. Hearing normal. Neck: Supple, non-tendern without palpable adenopathy  Respiratory:  Respiratory effort normal, BS equal bilaterally without rales, rhonchi, wheezing or stridor.  Cardio: RRR with no MRGs. Brisk peripheral pulses without edema.  Lymphatics: Non tender without lymphadenopathy.  Musculoskeletal: normal gait.  Skin: Warm, dry without rashes, lesions, ecchymosis.  Neuro: Cranial nerves intact. Normal muscle tone Psych: Awake and oriented X 3, normal affect, Insight and Judgment appropriate.     Izora Ribas, NP 10:49 AM [facility]

## 2020-05-11 ENCOUNTER — Other Ambulatory Visit: Payer: Self-pay | Admitting: *Deleted

## 2020-05-11 DIAGNOSIS — I10 Essential (primary) hypertension: Secondary | ICD-10-CM

## 2020-05-11 MED ORDER — ATENOLOL 100 MG PO TABS: ORAL_TABLET | ORAL | 0 refills | Status: DC

## 2020-06-04 ENCOUNTER — Other Ambulatory Visit: Payer: Self-pay | Admitting: Internal Medicine

## 2020-06-04 MED ORDER — ROSUVASTATIN CALCIUM 20 MG PO TABS: ORAL_TABLET | ORAL | 0 refills | Status: DC

## 2020-06-06 ENCOUNTER — Encounter: Payer: Managed Care, Other (non HMO) | Admitting: Adult Health

## 2020-06-13 ENCOUNTER — Other Ambulatory Visit: Payer: Self-pay | Admitting: Internal Medicine

## 2020-06-13 DIAGNOSIS — E1165 Type 2 diabetes mellitus with hyperglycemia: Secondary | ICD-10-CM

## 2020-06-13 MED ORDER — METFORMIN HCL ER 500 MG PO TB24
ORAL_TABLET | ORAL | 0 refills | Status: AC
Start: 2020-06-13 — End: ?

## 2020-06-24 ENCOUNTER — Other Ambulatory Visit: Payer: Self-pay

## 2020-06-24 ENCOUNTER — Ambulatory Visit (INDEPENDENT_AMBULATORY_CARE_PROVIDER_SITE_OTHER): Payer: Managed Care, Other (non HMO) | Admitting: Family Medicine

## 2020-06-24 ENCOUNTER — Encounter: Payer: Self-pay | Admitting: Family Medicine

## 2020-06-24 VITALS — BP 129/83 | HR 53 | Temp 97.8°F | Ht 75.5 in | Wt 270.6 lb

## 2020-06-24 DIAGNOSIS — E1122 Type 2 diabetes mellitus with diabetic chronic kidney disease: Secondary | ICD-10-CM

## 2020-06-24 DIAGNOSIS — E1169 Type 2 diabetes mellitus with other specified complication: Secondary | ICD-10-CM | POA: Diagnosis not present

## 2020-06-24 DIAGNOSIS — Z7689 Persons encountering health services in other specified circumstances: Secondary | ICD-10-CM | POA: Diagnosis not present

## 2020-06-24 DIAGNOSIS — M1711 Unilateral primary osteoarthritis, right knee: Secondary | ICD-10-CM

## 2020-06-24 DIAGNOSIS — N182 Chronic kidney disease, stage 2 (mild): Secondary | ICD-10-CM

## 2020-06-24 DIAGNOSIS — I10 Essential (primary) hypertension: Secondary | ICD-10-CM

## 2020-06-24 DIAGNOSIS — E669 Obesity, unspecified: Secondary | ICD-10-CM

## 2020-06-24 DIAGNOSIS — M1712 Unilateral primary osteoarthritis, left knee: Secondary | ICD-10-CM

## 2020-06-24 DIAGNOSIS — Z86010 Personal history of colonic polyps: Secondary | ICD-10-CM

## 2020-06-24 DIAGNOSIS — E785 Hyperlipidemia, unspecified: Secondary | ICD-10-CM

## 2020-06-24 MED ORDER — ATENOLOL 25 MG PO TABS: ORAL_TABLET | ORAL | 6 refills | Status: AC

## 2020-06-24 MED ORDER — ROSUVASTATIN CALCIUM 5 MG PO TABS: 5.0000 mg | ORAL_TABLET | Freq: Every day | ORAL | 6 refills | Status: DC

## 2020-06-24 NOTE — Progress Notes (Signed)
Office Visit Note   Patient: Dale Tapia           Date of Birth: 1961-05-11           MRN: [id number] Visit Date: 06/24/2020 Requested by: Unk Pinto, [address] [place],  [place] [id number] PCP: Eunice Blase, MD  Subjective: Chief Complaint  Patient presents with  . Other    Establish care     HPI: He is here to establish care.  He has had difficulty getting into see his PCP, and does not have confidence in the physician assistant at the practice.  He has hypertension which has been controlled with atenolol and hydrochlorothiazide.  He takes Micardis as well.  Blood pressure has been well controlled when he takes it at home.  He has made significant lifestyle changes and is very much interested in managing his health without medications if possible.  He has lost quite a bit of weight with a low carbohydrate diet.  His blood sugars have been better controlled with this eating as well.  His last A1c was 7.3.  He struggled to recover from COVID-19 illness earlier this year.  It took a while to get his energy back, and his breathing.  For a while now he has had cramping in his legs at the end of the day.  He is not sure what is causing it.  He has struggled with bilateral knee osteoarthritis and plans to have replacement at some point, but wants to postpone it is much as possible.  He struggles with head congestion related to seasonal allergies.  He had severe allergies when he was younger and used to get allergy shots.  He struggles with fatigue and feels that it is probably related to medications that he is taking.                ROS:   All other systems were reviewed and are negative.  Objective: Vital Signs: BP 129/83 (BP Location: Left Arm, Patient Position: Sitting, Cuff Size: Large)   Pulse (!) 53   Temp 97.8 F (36.6 C) (Oral)   Ht 6' 3.5" (1.918 m)   Wt 270 lb 9.6 oz (122.7 kg)   BMI 33.38 kg/m   Physical Exam:  General:  Alert  and oriented, in no acute distress. Pulm:  Breathing unlabored. Psy:  Normal mood, congruent affect.  HEENT:  [place]/AT, PERRLA, EOM Full, no nystagmus.  Funduscopic examination within normal limits.  No conjunctival erythema.  Tympanic membranes are pearly gray with normal landmarks.  External ear canals are normal.  Nasal passages are clear.  Oropharynx is clear.  No significant lymphadenopathy.  No thyromegaly or nodules.  2+ carotid pulses without bruits. CV: Regular rate and rhythm without murmurs, rubs, or gallops.  Trace bilateral ankle peripheral edema.  2+ radial and posterior tibial pulses. Lungs: Clear to auscultation throughout with no wheezing or areas of consolidation. Knees: He has tenderness across the medial joint lines.  He has 1+ laxity with valgus stress on both knees with solid endpoint.    Imaging: No results found.  Assessment & Plan: 1.  Hypertension, currently under good control. -We will taper off atenolol, and discontinue hydrochlorothiazide.  He will monitor his blood pressure closely.  Follow-up in 3 months for wellness exam with labs and blood pressure check.  2.  Diabetes, working on lifestyle. -We will discontinue Metformin due to concerns about renal function issues.  Recheck labs with A1c in 3 months.  Could contemplate Januvia if H2D looks worse.  3.  Hyperlipidemia, on Crestor.  Having muscle cramps which could be related. -We will decrease his dosage to 5 mg twice weekly.  4.  Bilateral knee end-stage medial compartment DJD -We will order custom medial compartment OA braces.     Procedures: No procedures performed        PMFS History: Patient Active Problem List   Diagnosis Date Noted  . History of colon polyps 06/05/2019  . Primary osteoarthritis of both knees 11/12/2018  . Arthrosis of right acromioclavicular joint 11/12/2018  . Type 2 diabetes mellitus with hyperglycemia ([place]) 04/11/2018  . Obesity (BMI 30.0-34.9) 05/03/2014  .  Hypertension   . Arthritis   . Allergic rhinitis   . Hyperlipidemia associated with type 2 diabetes mellitus ([place])    Past Medical History:  Diagnosis Date  . Allergic rhinitis, cause unspecified   . Arthritis    neck and knees  . Hyperlipidemia   . Hypertension   . Prediabetes     Family History  Problem Relation Age of Onset  . Hypertension Mother   . Arthritis Mother   . Sleep apnea Mother   . Hypertension Father   . Kidney disease Father   . Crohn's disease Father   . Alzheimer's disease Paternal Grandfather   . Crohn's disease Daughter   . Colon cancer Neg Hx     Past Surgical History:  Procedure Laterality Date  . COLONOSCOPY  2014   Social History   Occupational History  . Not on file  Tobacco Use  . Smoking status: Never Smoker  . Smokeless tobacco: Never Used  Vaping Use  . Vaping Use: Never used  Substance and Sexual Activity  . Alcohol use: No    Comment: rare  . Drug use: No  . Sexual activity: Yes    Partners: Female    Birth control/protection: Post-menopausal

## 2020-06-24 NOTE — Patient Instructions (Addendum)
    Changes:  1.  Stop metformin.  2.  Stop hydrochlorothiazide.  3.  Will change Crestor (rosuvastatin) to 5 mg tablets, to take 1 twice weekly.  4.  Atenolol:  Changing to 25 mg tablets.  You will taper as follows:  - Take 3 pills (75 mg) daily for 1 week. - Take 2 pills (50 mg) daily for 1 week. - Take 1 pill (25 mg) daily for 1 week. - Take 1/2 pill (12.5 mg) daily for 1 week, then stop.

## 2020-06-24 NOTE — Progress Notes (Signed)
Order, demographics and pertinent chart notes emailed to Donna Christen - has already responded that he will start working on this.

## 2020-07-06 ENCOUNTER — Telehealth: Payer: Self-pay | Admitting: Family Medicine

## 2020-07-06 ENCOUNTER — Encounter: Payer: Self-pay | Admitting: Family Medicine

## 2020-07-06 DIAGNOSIS — I10 Essential (primary) hypertension: Secondary | ICD-10-CM

## 2020-07-06 NOTE — Telephone Encounter (Signed)
Pt called and said his BP was 135/96 this morning. He states he doesn't get off work until 5 so if Karna Christmas needs to call him back he would like a message left   [phone]

## 2020-07-06 NOTE — Telephone Encounter (Signed)
I called and reached the patient's voice mail. Left message to call me back -- if he has to leave a message, please advise of what this morning's BP reading was.

## 2020-07-06 NOTE — Telephone Encounter (Signed)
Note sent through MyChart.

## 2020-07-06 NOTE — Telephone Encounter (Signed)
I called and spoke with the patient, advising him of the message Dr. Junius Roads sent him. The patient has run out of his telmisartan and needs it refilled, if he is to stay on this. I advised him to look at that message from Dr. Junius Roads and ask him directly about the telmisartan (Dr. Junius Roads is not in clinic tomorrow, but will check these messages periodically), and then Dr. Junius Roads will respond directly back to him.

## 2020-07-06 NOTE — Telephone Encounter (Signed)
Patient called requesting a call back. Patient states Dr. Junius Roads requesting for pt to keep an eye on blood pressure. Blood pressure was alittle elevated this morning and he is worried. Please call patient at [phone].

## 2020-07-06 NOTE — Telephone Encounter (Signed)
Please advise on the BP reading the patient got this morning. He is worried about the higher numbers.

## 2020-07-07 MED ORDER — TELMISARTAN 80 MG PO TABS: 80.0000 mg | ORAL_TABLET | Freq: Every day | ORAL | 1 refills | Status: DC

## 2020-09-23 ENCOUNTER — Ambulatory Visit: Payer: Managed Care, Other (non HMO) | Admitting: Family Medicine

## 2020-10-14 ENCOUNTER — Ambulatory Visit: Payer: Managed Care, Other (non HMO) | Admitting: Family Medicine

## 2021-01-08 ENCOUNTER — Encounter: Payer: Self-pay | Admitting: Family Medicine

## 2021-01-08 DIAGNOSIS — I10 Essential (primary) hypertension: Secondary | ICD-10-CM

## 2021-01-09 MED ORDER — TELMISARTAN 80 MG PO TABS: 80.0000 mg | ORAL_TABLET | Freq: Every day | ORAL | 1 refills | Status: AC

## 2021-01-09 MED ORDER — ROSUVASTATIN CALCIUM 5 MG PO TABS: 5.0000 mg | ORAL_TABLET | Freq: Every day | ORAL | 6 refills | Status: AC

## 2021-01-09 NOTE — Addendum Note (Signed)
Addended by: Hortencia Pilar on: 01/09/2021 10:02 AM   Modules accepted: Orders

## 2021-06-06 ENCOUNTER — Encounter: Payer: Managed Care, Other (non HMO) | Admitting: Adult Health

## 2022-06-25 ENCOUNTER — Ambulatory Visit (INDEPENDENT_AMBULATORY_CARE_PROVIDER_SITE_OTHER): Payer: Managed Care, Other (non HMO)

## 2022-06-25 ENCOUNTER — Ambulatory Visit (INDEPENDENT_AMBULATORY_CARE_PROVIDER_SITE_OTHER): Payer: Managed Care, Other (non HMO) | Admitting: Orthopaedic Surgery

## 2022-06-25 DIAGNOSIS — M25511 Pain in right shoulder: Secondary | ICD-10-CM

## 2022-06-25 DIAGNOSIS — G8929 Other chronic pain: Secondary | ICD-10-CM

## 2022-06-25 NOTE — Progress Notes (Signed)
Dale Tapia comes in today with mainly chief complaint of numbness and tingling going down his right arm.  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  He does feel like there is a small mass or more prominence of the right shoulder at the [facility] joint.  He said no known injury.  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  It has not been getting worse.  He is very active.  He has been wearing some gravity defining shoes which is a brand that he said is really help take pressure off his knees.  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  On exam his right shoulder moves smoothly and fluidly with no pain at all.  There is some prominence of the [facility] joint and a positive Neer and Hawkins sign but really no weakness of the shoulder and fluid and full range of motion.  He has no atrophy in his muscle groups of his right upper extremity down to his right hand.  There is good pinch and grip strength as well.  X-rays of the right shoulder show slight worsening of arthritis of the [facility] joint but the remainder of the shoulder appears normal.  Since his shoulder is really asymptomatic I would not recommend any other intervention.  It is concerning that he has this numbness and tingling that goes down into his right hand and at some point I do feel that he would benefit from an MRI of his cervical spine.  Right now he defers any further treatment since he is stable.
